# Patient Record
Sex: Male | Born: 1970 | Race: White | Hispanic: No | Smoking: Never smoker
Health system: Southern US, Community
[De-identification: ages and names within clinical notes are randomized; demographics above are authoritative.]

## PROBLEM LIST (undated history)

## (undated) ENCOUNTER — Ambulatory Visit: Admission: EM | Payer: No Typology Code available for payment source

## (undated) DIAGNOSIS — F4310 Post-traumatic stress disorder, unspecified: Secondary | ICD-10-CM

## (undated) DIAGNOSIS — M542 Cervicalgia: Secondary | ICD-10-CM

## (undated) DIAGNOSIS — K219 Gastro-esophageal reflux disease without esophagitis: Secondary | ICD-10-CM

## (undated) HISTORY — PX: INGUINAL HERNIA REPAIR: SUR1180

---

## 2018-06-01 DIAGNOSIS — N201 Calculus of ureter: Secondary | ICD-10-CM | POA: Insufficient documentation

## 2018-07-25 DIAGNOSIS — K589 Irritable bowel syndrome without diarrhea: Secondary | ICD-10-CM | POA: Insufficient documentation

## 2018-07-25 DIAGNOSIS — G40909 Epilepsy, unspecified, not intractable, without status epilepticus: Secondary | ICD-10-CM | POA: Insufficient documentation

## 2018-07-25 DIAGNOSIS — R55 Syncope and collapse: Secondary | ICD-10-CM | POA: Insufficient documentation

## 2018-07-25 DIAGNOSIS — F411 Generalized anxiety disorder: Secondary | ICD-10-CM | POA: Insufficient documentation

## 2020-11-08 ENCOUNTER — Emergency Department (HOSPITAL_COMMUNITY): Payer: No Typology Code available for payment source

## 2020-11-08 ENCOUNTER — Encounter (HOSPITAL_COMMUNITY): Payer: Self-pay | Admitting: Emergency Medicine

## 2020-11-08 ENCOUNTER — Emergency Department (HOSPITAL_COMMUNITY)
Admission: EM | Admit: 2020-11-08 | Discharge: 2020-11-08 | Disposition: A | Discharge status: Home / Self Care | Payer: No Typology Code available for payment source | Attending: Emergency Medicine | Admitting: Emergency Medicine

## 2020-11-08 DIAGNOSIS — M25511 Pain in right shoulder: Secondary | ICD-10-CM | POA: Insufficient documentation

## 2020-11-08 DIAGNOSIS — M62838 Other muscle spasm: Secondary | ICD-10-CM

## 2020-11-08 DIAGNOSIS — M542 Cervicalgia: Secondary | ICD-10-CM | POA: Insufficient documentation

## 2020-11-08 MED ORDER — METHOCARBAMOL 750 MG PO TABS: 750.0000 mg | ORAL_TABLET | Freq: Three times a day (TID) | ORAL | 0 refills | Status: DC | PRN

## 2020-11-08 MED ORDER — TRAMADOL HCL 50 MG PO TABS: 50.0000 mg | ORAL_TABLET | Freq: Four times a day (QID) | ORAL | 0 refills | Status: DC | PRN

## 2020-11-08 NOTE — ED Provider Notes (Signed)
[facility] EMERGENCY DEPARTMENT Provider Note   CSN: [id number] Arrival date & time: 11/08/20  1110     History Chief Complaint  Patient presents with   Shoulder Pain    right    Tommy Kaufman is a 50 y.o. male.  Patient c/o right shoulder and right neck pain in the past 4-5 days. Symptoms acute onset, dull, located right trapezius area and right shoulder posteriorly, mod-severe, constant, persistent, worse w certain movements of right shoulder and neck. Denies hx DDD. Denies recent trauma, fall, injury or strain. No fever or chills. Occasional numbness/tingling sensation in finger tips of right hand but no consistent or persistent loss of sensation or numbness. No loss of normal function or weakness. No fever or chills.   The history is provided by the patient.      History reviewed. No pertinent past medical history.  There are no problems to display for this patient.   History reviewed. No pertinent surgical history.     History reviewed. No pertinent family history.  Social History   Tobacco Use   Smoking status: Never   Smokeless tobacco: Never  Substance Use Topics   Alcohol use: Yes   Drug use: Not Currently    Home Medications Prior to Admission medications   Not on File    Allergies    Patient has no allergy information on record.  Review of Systems   Review of Systems  Constitutional:  Negative for chills and fever.  HENT:  Negative for sore throat.   Eyes:  Negative for redness.  Respiratory:  Negative for shortness of breath.   Cardiovascular:  Negative for chest pain.  Gastrointestinal:  Negative for abdominal pain.  Genitourinary:  Negative for flank pain.  Musculoskeletal:  Positive for neck pain.  Skin:  Negative for rash.  Neurological:  Negative for headaches.  Hematological:  Does not bruise/bleed easily.  Psychiatric/Behavioral:  Negative for confusion.    Physical Exam Updated Vital Signs BP 118/73 (BP  Location: Left Arm)   Pulse 65   Temp 98.6 F (37 C) (Oral)   Resp 19   SpO2 95%   Physical Exam Vitals and nursing note reviewed.  Constitutional:      Appearance: Normal appearance. He is well-developed.  HENT:     Head: Atraumatic.     Nose: Nose normal.     Mouth/Throat:     Mouth: Mucous membranes are moist.     Pharynx: Oropharynx is clear.  Eyes:     General: No scleral icterus.    Conjunctiva/sclera: Conjunctivae normal.  Neck:     Trachea: No tracheal deviation.     Comments: No neck stiffness or rigidity.  Cardiovascular:     Rate and Rhythm: Normal rate and regular rhythm.     Pulses: Normal pulses.     Heart sounds: Normal heart sounds. No murmur heard.   No friction rub. No gallop.  Pulmonary:     Effort: Pulmonary effort is normal. No accessory muscle usage or respiratory distress.     Breath sounds: Normal breath sounds.  Abdominal:     General: There is no distension.  Genitourinary:    Comments: No cva tenderness. Musculoskeletal:        General: No swelling.     Cervical back: Normal range of motion and neck supple. No rigidity.     Comments: Tenderness right lateral neck, right trapezius, and right posterior shoulder/right scapula area. Pain w active rom right shoulder. Minimal pain  w passive rom shoulder. C/T spine non tender, aligned. No RUE swelling. No sts noted. No skin changes or erythema noted. Radial pulse 2+.  Skin:    General: Skin is warm and dry.     Findings: No rash.     Comments: No skin lesions or rash in area of pain.   Neurological:     Mental Status: He is alert.     Comments: Alert, speech clear. RUE motor intact, stre 5/5. Sens grossly intact.   Psychiatric:        Mood and Affect: Mood normal.    ED Results / Procedures / Treatments   Labs (all labs ordered are listed, but only abnormal results are displayed) Labs Reviewed - No data to display  EKG EKG Interpretation  Date/Time:  Saturday November 08 2020 11:23:06  EDT Ventricular Rate:  71 PR Interval:  108 QRS Duration: 94 QT Interval:  364 QTC Calculation: 395 R Axis:   51 Text Interpretation: Sinus rhythm with short PR No previous tracing Confirmed by Cathren Laine ([id number]) on 11/08/2020 1:01:48 PM  Radiology DG Shoulder Right  Result Date: 11/08/2020 CLINICAL DATA:  RIGHT shoulder pain for 4 days. History of shoulder dislocation in the past in a 50 year old male. EXAM: RIGHT SHOULDER - 2+ VIEW COMPARISON:  None FINDINGS: RIGHT shoulder is located. No sign of acute fracture. No significant arthropathy. Soft tissues are unremarkable. IMPRESSION: No acute findings. Electronically Signed   By: Donzetta Kohut M.D.   On: 11/08/2020 12:38   CT Cervical Spine Wo Contrast  Result Date: 11/08/2020 CLINICAL DATA:  Cervical radiculopathy EXAM: CT CERVICAL SPINE WITHOUT CONTRAST TECHNIQUE: Multidetector CT imaging of the cervical spine was performed without intravenous contrast. Multiplanar CT image reconstructions were also generated. COMPARISON:  None. FINDINGS: Alignment: Normal Skull base and vertebrae: No acute fracture. No primary bone lesion or focal pathologic process. Soft tissues and spinal canal: No prevertebral fluid or swelling. No visible canal hematoma. Disc levels: Disc space narrowing and early spurring at C5-6. Early bilateral degenerative facet disease. No significant neural foraminal narrowing. Upper chest: No acute findings Other: None IMPRESSION: No acute bony abnormality.  Early degenerative changes. Electronically Signed   By: Charlett Nose M.D.   On: 11/08/2020 12:15    Procedures Procedures   Medications Ordered in ED Medications - No data to display  ED Course  I have reviewed the triage vital signs and the nursing notes.  Pertinent labs & imaging results that were available during my care of the patient were reviewed by me and considered in my medical decision making (see chart for details).    MDM Rules/Calculators/A&P                           Xrays.  Pt drove self to ED.   No meds pta. Acetaminophen po. Ibuprofen po.   CT reviewed/interpreted by me - degen changes, no acute process.  Xrays shoulder reviewed/interpreted by me - no fx or dislocation.   Rx for home/robaxin/ultram.    Final Clinical Impression(s) / ED Diagnoses Final diagnoses:  None    Rx / DC Orders ED Discharge Orders     None        Cathren Laine, MD 11/08/20 1302

## 2020-11-08 NOTE — ED Triage Notes (Signed)
Pt reports R shoulder blade pain that radiates to R axilla x 4 days.  No known injury.  Reports mild neck pain and numbness to R fingers.

## 2020-11-08 NOTE — Discharge Instructions (Signed)
It was our pleasure to provide your ER care today - we hope that you feel better.  Try gentle massage and heat therapy to sore area.   Take ibuprofen or aleve as need for pain. You may also take ultram as need for pain and robaxin as need for muscle spasm/pain - no driving when taking these meds.   Follow up with primary care doctor in the coming week.   Return to ER if worse, new symptoms, fevers, intractable pain, numbness/weakness, or other concern.

## 2020-12-15 DIAGNOSIS — M5412 Radiculopathy, cervical region: Secondary | ICD-10-CM | POA: Insufficient documentation

## 2021-02-11 DIAGNOSIS — G5621 Lesion of ulnar nerve, right upper limb: Secondary | ICD-10-CM | POA: Insufficient documentation

## 2021-09-30 ENCOUNTER — Encounter (HOSPITAL_COMMUNITY): Payer: Self-pay | Admitting: Emergency Medicine

## 2021-09-30 ENCOUNTER — Ambulatory Visit (HOSPITAL_COMMUNITY)
Admission: EM | Admit: 2021-09-30 | Discharge: 2021-09-30 | Disposition: A | Discharge status: Home / Self Care | Payer: No Typology Code available for payment source | Attending: Urgent Care | Admitting: Urgent Care

## 2021-09-30 DIAGNOSIS — R1013 Epigastric pain: Secondary | ICD-10-CM | POA: Insufficient documentation

## 2021-09-30 DIAGNOSIS — G629 Polyneuropathy, unspecified: Secondary | ICD-10-CM | POA: Diagnosis present

## 2021-09-30 HISTORY — DX: Gastro-esophageal reflux disease without esophagitis: K21.9

## 2021-09-30 HISTORY — DX: Cervicalgia: M54.2

## 2021-09-30 HISTORY — DX: Post-traumatic stress disorder, unspecified: F43.10

## 2021-09-30 LAB — VITAMIN B12: Vitamin B-12: 299 pg/mL (ref 180–914)

## 2021-09-30 MED ORDER — BACLOFEN 10 MG PO TABS: 10.0000 mg | ORAL_TABLET | Freq: Three times a day (TID) | ORAL | 0 refills | Status: DC

## 2021-09-30 NOTE — Discharge Instructions (Addendum)
Your EKG is the same as it was in 2022. I do not suspect this to be cardiac in nature. Given your new vegetarianism, I have ordered a B12 level. You may consider purchasing over-the-counter sublingual B12 for supplementation. I suspect your symptoms are related to esophageal spasm. Please start taking baclofen up to 3 times daily.  Usual treatments are calcium channel blockers, but I would prefer this to come from your primary care physician should you require additional treatment. Please start taking your pantoprazole once daily as well.
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
## 2021-09-30 NOTE — ED Triage Notes (Signed)
Patient c/o LFT arm tingling that started today at 0800.   Patient c/o chest tightness that started today 1200.   Patient denies headache.   Patient endorses SOB upon exertion. Patient endorses cervical spine chronic issue.   Patient denies cardiac history.

## 2021-10-02 ENCOUNTER — Telehealth: Payer: Self-pay | Admitting: Urgent Care

## 2021-10-02 NOTE — Telephone Encounter (Signed)
Called and spoke with pt, verified identity with two identifiers, regarding his B12 of 299. Recommended OTC SL B12, he states he picked it up last night. [facility] due to continued neuropathy. Denies any additional complaints, all questions answered

## 2021-12-28 ENCOUNTER — Encounter (HOSPITAL_COMMUNITY): Payer: Self-pay

## 2021-12-28 ENCOUNTER — Ambulatory Visit (INDEPENDENT_AMBULATORY_CARE_PROVIDER_SITE_OTHER): Payer: No Typology Code available for payment source

## 2021-12-28 ENCOUNTER — Ambulatory Visit (HOSPITAL_COMMUNITY)
Admission: EM | Admit: 2021-12-28 | Discharge: 2021-12-28 | Disposition: A | Discharge status: Home / Self Care | Payer: No Typology Code available for payment source | Attending: Internal Medicine | Admitting: Internal Medicine

## 2021-12-28 DIAGNOSIS — S060X0A Concussion without loss of consciousness, initial encounter: Secondary | ICD-10-CM

## 2021-12-28 DIAGNOSIS — R079 Chest pain, unspecified: Secondary | ICD-10-CM

## 2021-12-28 DIAGNOSIS — S20212A Contusion of left front wall of thorax, initial encounter: Secondary | ICD-10-CM | POA: Diagnosis not present

## 2021-12-28 MED ORDER — NAPROXEN 375 MG PO TABS: 375.0000 mg | ORAL_TABLET | Freq: Two times a day (BID) | ORAL | 0 refills | Status: DC
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
## 2021-12-28 NOTE — Discharge Instructions (Addendum)
Apply cold compresses intermittently as needed.   These take medications as prescribed Your chest x-ray is negative for rib fracture Deep breathing exercises recommended to prevent pneumonia If you have worsening headaches, blurred vision, double vision, persistent nausea/vomiting-please go to the emergency department for further evaluation.
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
## 2022-02-20 IMAGING — DX DG SHOULDER 2+V*R*
4 series · 4 of 4 positions shown · non-contrast
Comparison: None

CLINICAL DATA: RIGHT shoulder pain for 4 days. History of shoulder
dislocation in the past in a 50-year-old male.

EXAM:
RIGHT SHOULDER - 2+ VIEW

[shoulder grashey]
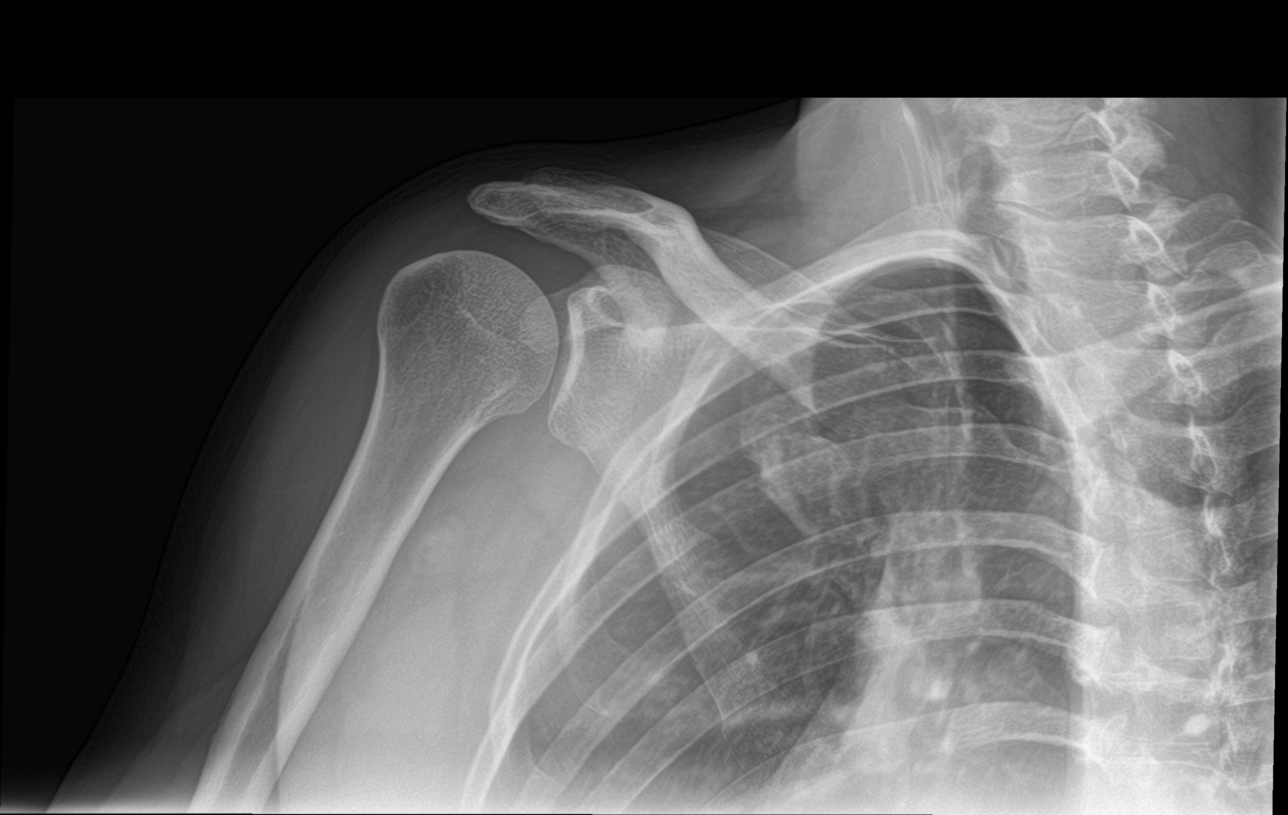

[shoulder y view]
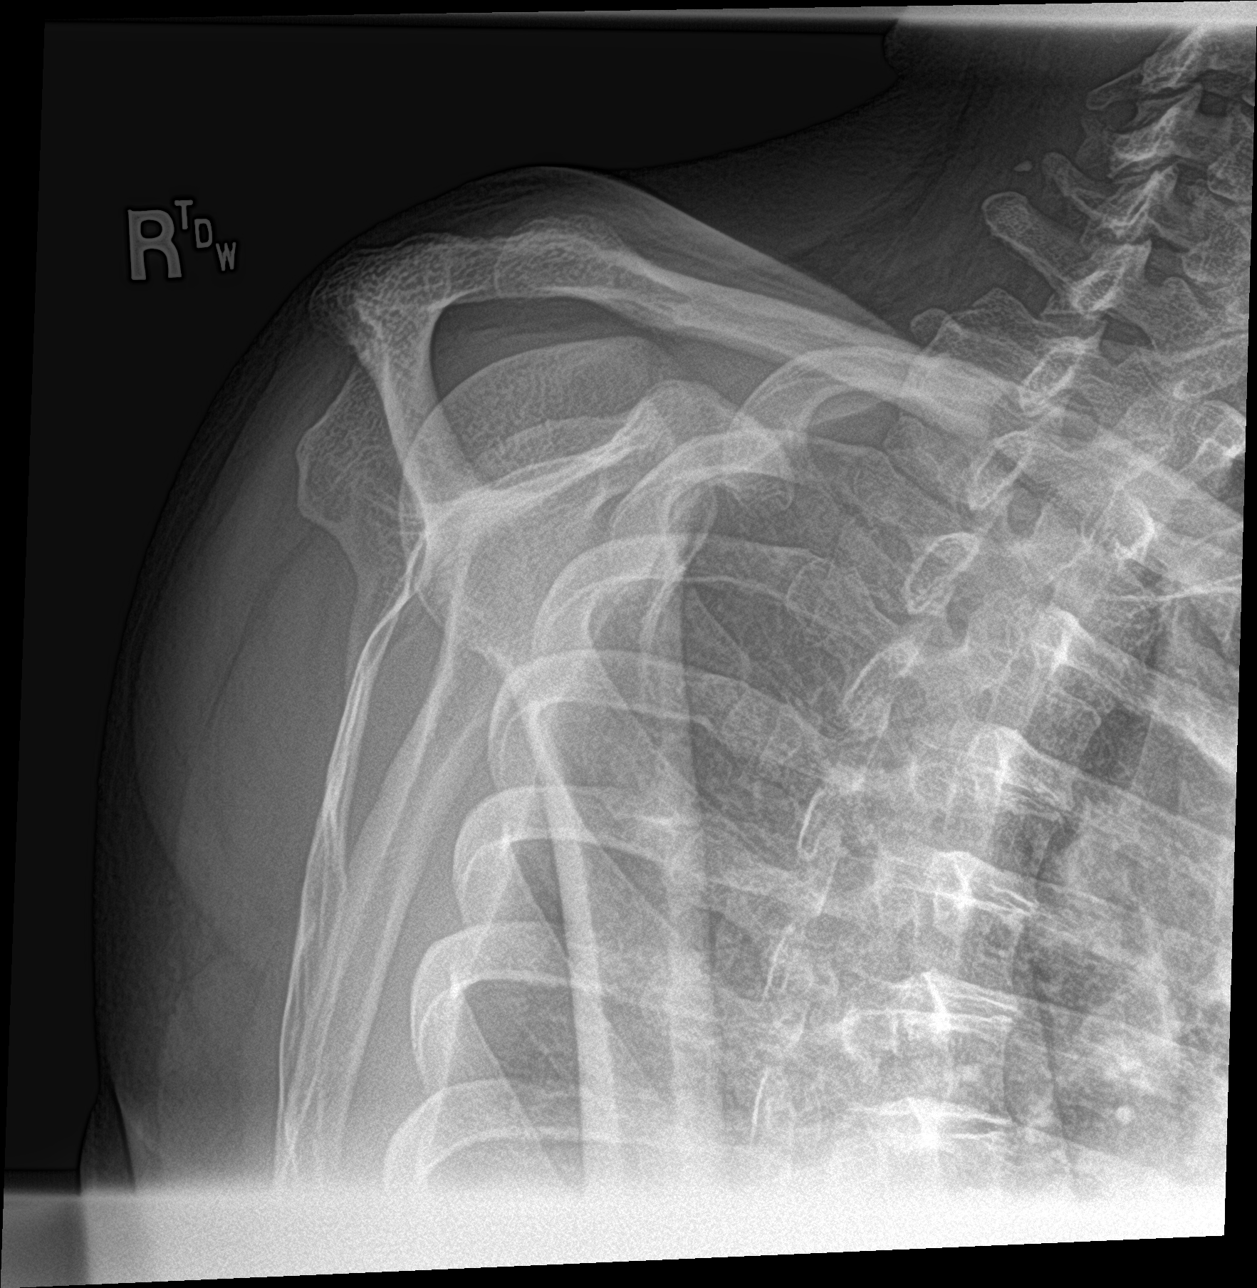

[shoulder axillary]
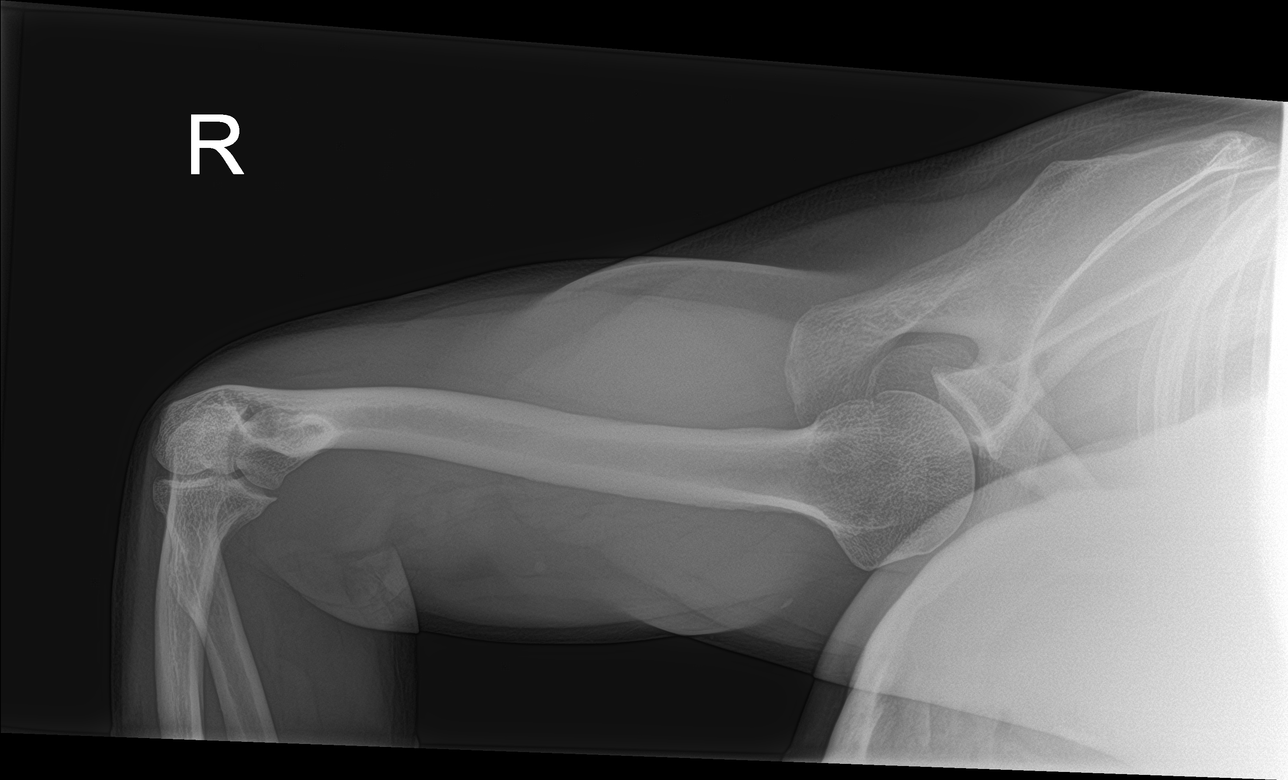

[shoulder ap neutral]
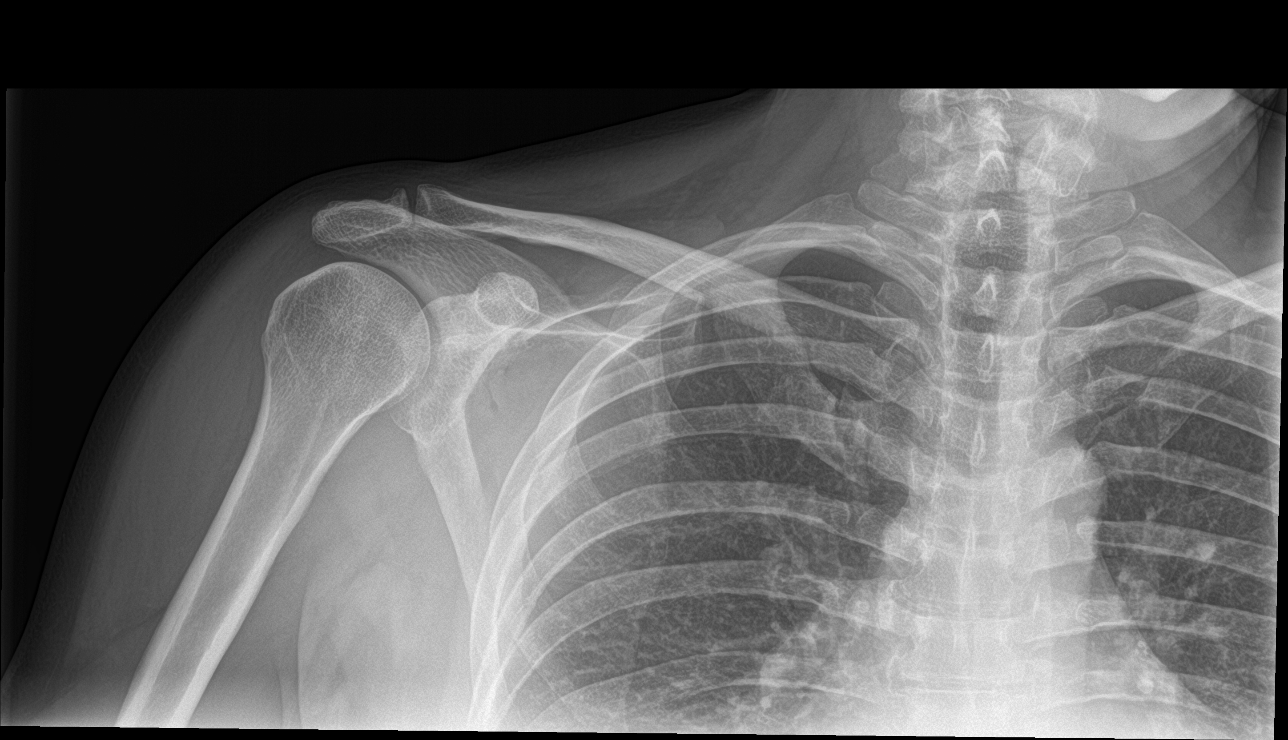

[4 of 4 positions shown; findings below may reference images not displayed]

FINDINGS: RIGHT shoulder is located. No sign of acute fracture. No significant
arthropathy. Soft tissues are unremarkable.
IMPRESSION: No acute findings.

## 2023-03-12 ENCOUNTER — Encounter (HOSPITAL_COMMUNITY): Payer: Self-pay

## 2023-03-12 ENCOUNTER — Ambulatory Visit (HOSPITAL_COMMUNITY)
Admission: EM | Admit: 2023-03-12 | Discharge: 2023-03-12 | Disposition: A | Discharge status: Home / Self Care | Payer: No Typology Code available for payment source | Attending: Family Medicine | Admitting: Family Medicine

## 2023-03-12 DIAGNOSIS — N200 Calculus of kidney: Secondary | ICD-10-CM | POA: Diagnosis not present

## 2023-03-12 DIAGNOSIS — R310 Gross hematuria: Secondary | ICD-10-CM | POA: Diagnosis not present

## 2023-03-12 LAB — POCT URINALYSIS DIP (MANUAL ENTRY)
Bilirubin, UA: NEGATIVE
Glucose, UA: NEGATIVE mg/dL
Ketones, POC UA: NEGATIVE mg/dL
Leukocytes, UA: NEGATIVE
Nitrite, UA: NEGATIVE
Protein Ur, POC: NEGATIVE mg/dL
Spec Grav, UA: 1.02 (ref 1.010–1.025)
Urobilinogen, UA: 0.2 U/dL — AB
pH, UA: 5.5 (ref 5.0–8.0)

## 2023-03-12 MED ORDER — TAMSULOSIN HCL 0.4 MG PO CAPS: 0.4000 mg | ORAL_CAPSULE | Freq: Every day | ORAL | 2 refills | Status: DC

## 2023-03-12 MED ORDER — TAMSULOSIN HCL 0.4 MG PO CAPS: 0.4000 mg | ORAL_CAPSULE | Freq: Every day | ORAL | 0 refills | Status: AC

## 2023-03-12 MED ORDER — KETOROLAC TROMETHAMINE 10 MG PO TABS: 10.0000 mg | ORAL_TABLET | Freq: Four times a day (QID) | ORAL | 0 refills | Status: AC | PRN

## 2023-03-12 MED ORDER — KETOROLAC TROMETHAMINE 30 MG/ML IJ SOLN
INTRAMUSCULAR | Status: AC
  Filled 2023-03-12: qty 1

## 2023-03-12 MED ORDER — KETOROLAC TROMETHAMINE 30 MG/ML IJ SOLN
30.0000 mg | Freq: Once | INTRAMUSCULAR | Status: AC
  Administered 2023-03-12: 30 mg via INTRAMUSCULAR

## 2023-03-12 MED ORDER — ONDANSETRON 4 MG PO TBDP: 4.0000 mg | ORAL_TABLET | Freq: Three times a day (TID) | ORAL | 0 refills | Status: AC | PRN

## 2023-03-12 NOTE — ED Triage Notes (Signed)
Patient here today with c/o LB pain and blood in his urine X 4-5 days. Worsening over the past few days. Patient has h/o kidney stones.

## 2023-03-12 NOTE — ED Provider Notes (Signed)
[facility]    CSN: [id number] Arrival date & time: 03/12/23  1213      History   Chief Complaint Chief Complaint  Patient presents with   Hematuria    HPI Tommy Kaufman is a 52 y.o. male.    Hematuria  Here for right low back pain and right lower abdominal pain and hematuria.  He had been having a little bit of low-grade right low back pain for about 5 days.  But at 330 this morning he awoke with more severe pain in his began to see blood in his urine today.  No fever or chills and no vomiting or diarrhea.  He has had a little bit of nausea off and on the last 5 days.  He does have a history of kidney stones and is seeing urology in the past  Last kidney stone was about 5 years ago  He is not allergic any medications  Last EGFR in care everywhere was greater than 90.   Past Medical History:  Diagnosis Date   Cervical spine pain    GERD (gastroesophageal reflux disease)    PTSD (post-traumatic stress disorder)     There are no active problems to display for this patient.   History reviewed. No pertinent surgical history.     Home Medications    Prior to Admission medications   Medication Sig Start Date End Date Taking? Authorizing Provider  ketorolac (TORADOL) 10 MG tablet Take 1 tablet (10 mg total) by mouth every 6 (six) hours as needed (pain). 03/12/23  Yes Zenia Resides, MD  ondansetron (ZOFRAN-ODT) 4 MG disintegrating tablet Take 1 tablet (4 mg total) by mouth every 8 (eight) hours as needed for nausea or vomiting. 03/12/23  Yes Kashawn Dirr, Janace Aris, MD  gabapentin (NEURONTIN) 300 MG capsule Take 300 mg by mouth 2 (two) times daily.    [provider]  pantoprazole (PROTONIX) 20 MG tablet Take 20 mg by mouth daily.    [provider]  tamsulosin (FLOMAX) 0.4 MG CAPS capsule Take 1 capsule (0.4 mg total) by mouth daily. 03/12/23   Zenia Resides, MD    Family History History reviewed. No pertinent family  history.  Social History Social History   Tobacco Use   Smoking status: Never   Smokeless tobacco: Never  Substance Use Topics   Alcohol use: Yes   Drug use: Not Currently     Allergies   Patient has no known allergies.   Review of Systems Review of Systems  Genitourinary:  Positive for hematuria.     Physical Exam Triage Vital Signs ED Triage Vitals [03/12/23 1227]  Encounter Vitals Group     BP 121/80     Systolic BP Percentile      Diastolic BP Percentile      Pulse Rate 66     Resp 16     Temp 97.9 F (36.6 C)     Temp Source Oral     SpO2 96 %     Weight 185 lb (83.9 kg)     Height 5\' 8"  (1.727 m)     Head Circumference      Peak Flow      Pain Score 4     Pain Loc      Pain Education      Exclude from Growth Chart    No data found.  Updated Vital Signs BP 121/80 (BP Location: Left Arm)   Pulse 66   Temp 97.9 F (  36.6 C) (Oral)   Resp 16   Ht 5\' 8"  (1.727 m)   Wt 83.9 kg   SpO2 96%   BMI 28.13 kg/m   Visual Acuity Right Eye Distance:   Left Eye Distance:   Bilateral Distance:    Right Eye Near:   Left Eye Near:    Bilateral Near:     Physical Exam Vitals reviewed.  Constitutional:      General: He is not in acute distress.    Appearance: He is not toxic-appearing.  HENT:     Mouth/Throat:     Mouth: Mucous membranes are moist.     Pharynx: No oropharyngeal exudate or posterior oropharyngeal erythema.  Eyes:     Extraocular Movements: Extraocular movements intact.     Conjunctiva/sclera: Conjunctivae normal.     Pupils: Pupils are equal, round, and reactive to light.  Cardiovascular:     Rate and Rhythm: Normal rate and regular rhythm.     Heart sounds: No murmur heard. Pulmonary:     Effort: Pulmonary effort is normal.     Breath sounds: Normal breath sounds.  Abdominal:     General: There is no distension.     Palpations: Abdomen is soft.     Tenderness: There is right CVA tenderness. There is no left CVA tenderness.      Comments: There is tenderness in the right lower abdomen and in the right flank.  Musculoskeletal:     Cervical back: Neck supple.  Lymphadenopathy:     Cervical: No cervical adenopathy.  Skin:    Capillary Refill: Capillary refill takes less than 2 seconds.     Coloration: Skin is not jaundiced or pale.     Findings: No rash.  Neurological:     General: No focal deficit present.     Mental Status: He is alert and oriented to person, place, and time.  Psychiatric:        Behavior: Behavior normal.      UC Treatments / Results  Labs (all labs ordered are listed, but only abnormal results are displayed) Labs Reviewed  POCT URINALYSIS DIP (MANUAL ENTRY) - Abnormal; Notable for the following components:      Result Value   Blood, UA moderate (*)    Urobilinogen, UA 0.2 (*)    All other components within normal limits    EKG   Radiology No results found.  Procedures Procedures (including critical care time)  Medications Ordered in UC Medications  ketorolac (TORADOL) 30 MG/ML injection 30 mg (has no administration in time range)    Initial Impression / Assessment and Plan / UC Course  I have reviewed the triage vital signs and the nursing notes.  Pertinent labs & imaging results that were available during my care of the patient were reviewed by me and considered in my medical decision making (see chart for details).     There is a moderate amount of blood on the UA.  Toradol injection is given here and Toradol tablets are sent to the pharmacy.  Flomax is also sent in for 10-day supply.  [facility]  Final Clinical Impressions(s) / UC Diagnoses   Final diagnoses:  Renal stone  Gross hematuria     Discharge Instructions      There is a large amount of blood on your UA as expected.  You have been given a shot of Toradol 30 mg today.  Ketorolac 10 mg tablets--take 1 tablet every 6 hours as  needed for pain.  This is the same medicine that  is in the shot we just gave you  Tamsulosin 0.4 mg--1 daily.  This is to help urinary flow  Ondansetron dissolved in the mouth every 8 hours as needed for nausea or vomiting.      ED Prescriptions     Medication Sig Dispense Auth. Provider   ketorolac (TORADOL) 10 MG tablet Take 1 tablet (10 mg total) by mouth every 6 (six) hours as needed (pain). 20 tablet Welton Bord, Janace Aris, MD   ondansetron (ZOFRAN-ODT) 4 MG disintegrating tablet Take 1 tablet (4 mg total) by mouth every 8 (eight) hours as needed for nausea or vomiting. 10 tablet Zenia Resides, MD   tamsulosin (FLOMAX) 0.4 MG CAPS capsule  (Status: Discontinued) Take 1 capsule (0.4 mg total) by mouth daily. 30 capsule Zenia Resides, MD   tamsulosin (FLOMAX) 0.4 MG CAPS capsule Take 1 capsule (0.4 mg total) by mouth daily. 10 capsule Zenia Resides, MD      I have reviewed the PDMP during this encounter.   Zenia Resides, MD 03/12/23 [phone]

## 2023-03-12 NOTE — Discharge Instructions (Signed)
There is a large amount of blood on your UA as expected.  You have been given a shot of Toradol 30 mg today.  Ketorolac 10 mg tablets--take 1 tablet every 6 hours as needed for pain.  This is the same medicine that is in the shot we just gave you  Tamsulosin 0.4 mg--1 daily.  This is to help urinary flow  Ondansetron dissolved in the mouth every 8 hours as needed for nausea or vomiting.

## 2023-09-27 ENCOUNTER — Inpatient Hospital Stay (HOSPITAL_COMMUNITY)
Admission: EM | Admit: 2023-09-27 | Discharge: 2023-10-01 | DRG: 335 | Disposition: A | Discharge status: Home / Self Care | Attending: Surgery | Admitting: Surgery

## 2023-09-27 ENCOUNTER — Encounter (HOSPITAL_COMMUNITY): Payer: Self-pay

## 2023-09-27 ENCOUNTER — Other Ambulatory Visit: Payer: Self-pay

## 2023-09-27 DIAGNOSIS — K659 Peritonitis, unspecified: Secondary | ICD-10-CM | POA: Diagnosis present

## 2023-09-27 DIAGNOSIS — K529 Noninfective gastroenteritis and colitis, unspecified: Principal | ICD-10-CM | POA: Diagnosis present

## 2023-09-27 DIAGNOSIS — R1084 Generalized abdominal pain: Secondary | ICD-10-CM | POA: Diagnosis not present

## 2023-09-27 DIAGNOSIS — N200 Calculus of kidney: Secondary | ICD-10-CM | POA: Diagnosis present

## 2023-09-27 DIAGNOSIS — Z87442 Personal history of urinary calculi: Secondary | ICD-10-CM

## 2023-09-27 DIAGNOSIS — F4310 Post-traumatic stress disorder, unspecified: Secondary | ICD-10-CM | POA: Diagnosis present

## 2023-09-27 DIAGNOSIS — G40909 Epilepsy, unspecified, not intractable, without status epilepticus: Secondary | ICD-10-CM | POA: Diagnosis present

## 2023-09-27 NOTE — ED Triage Notes (Signed)
 Pt complaining of abdominal pain that started around 5-6 pm today. Had a colonoscopy this morning where they removed 2 polyps. Pt is having hot and cold chills, is bloated, has been vomiting.

## 2023-09-28 ENCOUNTER — Inpatient Hospital Stay (HOSPITAL_COMMUNITY): Admitting: Certified Registered Nurse Anesthetist

## 2023-09-28 ENCOUNTER — Emergency Department (HOSPITAL_COMMUNITY)

## 2023-09-28 ENCOUNTER — Encounter (HOSPITAL_COMMUNITY): Payer: Self-pay

## 2023-09-28 ENCOUNTER — Encounter (HOSPITAL_COMMUNITY): Admission: EM | Disposition: A | Discharge status: Home / Self Care | Payer: Self-pay | Source: Home / Self Care

## 2023-09-28 ENCOUNTER — Other Ambulatory Visit: Payer: Self-pay

## 2023-09-28 DIAGNOSIS — K529 Noninfective gastroenteritis and colitis, unspecified: Principal | ICD-10-CM

## 2023-09-28 DIAGNOSIS — F32A Depression, unspecified: Secondary | ICD-10-CM | POA: Insufficient documentation

## 2023-09-28 DIAGNOSIS — K659 Peritonitis, unspecified: Secondary | ICD-10-CM

## 2023-09-28 DIAGNOSIS — N200 Calculus of kidney: Secondary | ICD-10-CM | POA: Diagnosis present

## 2023-09-28 DIAGNOSIS — F513 Sleepwalking [somnambulism]: Secondary | ICD-10-CM | POA: Insufficient documentation

## 2023-09-28 DIAGNOSIS — G43109 Migraine with aura, not intractable, without status migrainosus: Secondary | ICD-10-CM | POA: Insufficient documentation

## 2023-09-28 DIAGNOSIS — F4312 Post-traumatic stress disorder, chronic: Secondary | ICD-10-CM | POA: Insufficient documentation

## 2023-09-28 DIAGNOSIS — Z87442 Personal history of urinary calculi: Secondary | ICD-10-CM | POA: Diagnosis not present

## 2023-09-28 DIAGNOSIS — F4310 Post-traumatic stress disorder, unspecified: Secondary | ICD-10-CM | POA: Diagnosis present

## 2023-09-28 DIAGNOSIS — G40909 Epilepsy, unspecified, not intractable, without status epilepticus: Secondary | ICD-10-CM | POA: Diagnosis present

## 2023-09-28 DIAGNOSIS — R1084 Generalized abdominal pain: Secondary | ICD-10-CM | POA: Diagnosis present

## 2023-09-28 HISTORY — PX: LAPAROSCOPIC ABDOMINAL EXPLORATION: SHX6249

## 2023-09-28 LAB — COMPREHENSIVE METABOLIC PANEL WITH GFR
ALT: 30 U/L (ref 0–44)
AST: 21 U/L (ref 15–41)
Albumin: 4 g/dL (ref 3.5–5.0)
Alkaline Phosphatase: 51 U/L (ref 38–126)
Anion gap: 9 (ref 5–15)
BUN: 12 mg/dL (ref 6–20)
CO2: 23 mmol/L (ref 22–32)
Calcium: 8.7 mg/dL — ABNORMAL LOW (ref 8.9–10.3)
Chloride: 106 mmol/L (ref 98–111)
Creatinine, Ser: 0.95 mg/dL (ref 0.61–1.24)
GFR, Estimated: >60 mL/min (ref >60)
Glucose, Bld: 118 mg/dL — ABNORMAL HIGH (ref 70–99)
Potassium: 3.7 mmol/L (ref 3.5–5.1)
Sodium: 138 mmol/L (ref 135–145)
Total Bilirubin: 1.4 mg/dL — ABNORMAL HIGH (ref 0.0–1.2)
Total Protein: 6.9 g/dL (ref 6.5–8.1)

## 2023-09-28 LAB — CBC
HCT: 43.4 % (ref 39.0–52.0)
Hemoglobin: 14.8 g/dL (ref 13.0–17.0)
MCH: 31.4 pg (ref 26.0–34.0)
MCHC: 34.1 g/dL (ref 30.0–36.0)
MCV: 91.9 fL (ref 80.0–100.0)
Platelets: 200 K/uL (ref 150–400)
RBC: 4.72 MIL/uL (ref 4.22–5.81)
RDW: 12.7 % (ref 11.5–15.5)
WBC: 16.1 K/uL — ABNORMAL HIGH (ref 4.0–10.5)
nRBC: 0 % (ref 0.0–0.2)

## 2023-09-28 LAB — URINALYSIS, ROUTINE W REFLEX MICROSCOPIC
Bacteria, UA: NONE SEEN
Bilirubin Urine: NEGATIVE
Glucose, UA: NEGATIVE mg/dL
Ketones, ur: NEGATIVE mg/dL
Leukocytes,Ua: NEGATIVE
Nitrite: NEGATIVE
Protein, ur: NEGATIVE mg/dL
Specific Gravity, Urine: 1.02 (ref 1.005–1.030)
pH: 5 (ref 5.0–8.0)

## 2023-09-28 LAB — LIPASE, BLOOD: Lipase: 31 U/L (ref 11–51)

## 2023-09-28 SURGERY — EXPLORATION, ABDOMEN, LAPAROSCOPIC
Anesthesia: General | Site: Abdomen

## 2023-09-28 MED ORDER — 0.9 % SODIUM CHLORIDE (POUR BTL) OPTIME
TOPICAL | Status: DC | PRN
Start: 2023-09-28 — End: 2023-09-28
  Administered 2023-09-28 (×2): 1000 mL

## 2023-09-28 MED ORDER — SODIUM CHLORIDE 0.9 % IV BOLUS
1000.0000 mL | Freq: Once | INTRAVENOUS | Status: AC
  Administered 2023-09-28: 1000 mL via INTRAVENOUS

## 2023-09-28 MED ORDER — DEXAMETHASONE SODIUM PHOSPHATE 10 MG/ML IJ SOLN
INTRAMUSCULAR | Status: AC
  Filled 2023-09-28: qty 1

## 2023-09-28 MED ORDER — HYDROMORPHONE HCL 1 MG/ML IJ SOLN
0.2500 mg | INTRAMUSCULAR | Status: DC | PRN
  Administered 2023-09-28 (×4): 0.5 mg via INTRAVENOUS

## 2023-09-28 MED ORDER — CHLORHEXIDINE GLUCONATE 0.12 % MT SOLN
15.0000 mL | Freq: Once | OROMUCOSAL | Status: AC
  Administered 2023-09-28: 15 mL via OROMUCOSAL

## 2023-09-28 MED ORDER — ONDANSETRON HCL 4 MG/2ML IJ SOLN
INTRAMUSCULAR | Status: AC
Start: 2023-09-28 — End: 2023-09-28
  Filled 2023-09-28: qty 2

## 2023-09-28 MED ORDER — HYDROMORPHONE HCL 1 MG/ML IJ SOLN
1.0000 mg | INTRAMUSCULAR | Status: DC | PRN
  Administered 2023-09-28: 1 mg via INTRAVENOUS
  Administered 2023-09-28 (×2): 2 mg via INTRAVENOUS
  Administered 2023-09-29: 1 mg via INTRAVENOUS
  Administered 2023-09-29 (×4): 2 mg via INTRAVENOUS
  Administered 2023-09-29: 1 mg via INTRAVENOUS
  Administered 2023-09-29 (×2): 2 mg via INTRAVENOUS
  Administered 2023-09-30: 1 mg via INTRAVENOUS
  Administered 2023-09-30 (×2): 2 mg via INTRAVENOUS
  Administered 2023-09-30: 1 mg via INTRAVENOUS
  Filled 2023-09-28 (×2): qty 2
  Filled 2023-09-28: qty 1
  Filled 2023-09-28: qty 2
  Filled 2023-09-28: qty 1
  Filled 2023-09-28 (×4): qty 2
  Filled 2023-09-28: qty 1
  Filled 2023-09-28: qty 2
  Filled 2023-09-28 (×2): qty 1
  Filled 2023-09-28 (×2): qty 2

## 2023-09-28 MED ORDER — IOHEXOL 300 MG/ML  SOLN
100.0000 mL | Freq: Once | INTRAMUSCULAR | Status: AC | PRN
  Administered 2023-09-28: 100 mL via INTRAVENOUS

## 2023-09-28 MED ORDER — DEXAMETHASONE SODIUM PHOSPHATE 10 MG/ML IJ SOLN
INTRAMUSCULAR | Status: DC | PRN
  Administered 2023-09-28: 10 mg via INTRAVENOUS

## 2023-09-28 MED ORDER — METHOCARBAMOL 500 MG PO TABS
500.0000 mg | ORAL_TABLET | Freq: Three times a day (TID) | ORAL | Status: DC
  Administered 2023-09-28 – 2023-10-01 (×9): 500 mg via ORAL
  Filled 2023-09-28 (×10): qty 1

## 2023-09-28 MED ORDER — HYDROMORPHONE HCL 1 MG/ML IJ SOLN
INTRAMUSCULAR | Status: AC
Start: 2023-09-28 — End: 2023-09-28
  Filled 2023-09-28: qty 1

## 2023-09-28 MED ORDER — ENOXAPARIN SODIUM 40 MG/0.4ML IJ SOSY: 40.0000 mg | PREFILLED_SYRINGE | INTRAMUSCULAR | Status: DC

## 2023-09-28 MED ORDER — LACTATED RINGERS IV SOLN: INTRAVENOUS | Status: DC

## 2023-09-28 MED ORDER — ROCURONIUM BROMIDE 10 MG/ML (PF) SYRINGE
PREFILLED_SYRINGE | INTRAVENOUS | Status: AC
  Filled 2023-09-28: qty 10

## 2023-09-28 MED ORDER — OXYCODONE HCL 5 MG PO TABS
5.0000 mg | ORAL_TABLET | ORAL | Status: DC | PRN
  Administered 2023-09-29 – 2023-10-01 (×7): 10 mg via ORAL
  Filled 2023-09-28 (×8): qty 2

## 2023-09-28 MED ORDER — FENTANYL CITRATE (PF) 100 MCG/2ML IJ SOLN
INTRAMUSCULAR | Status: AC
Start: 2023-09-28 — End: 2023-09-28
  Filled 2023-09-28: qty 2

## 2023-09-28 MED ORDER — ACETAMINOPHEN 650 MG RE SUPP: 650.0000 mg | Freq: Four times a day (QID) | RECTAL | Status: DC

## 2023-09-28 MED ORDER — HYDROMORPHONE HCL 1 MG/ML IJ SOLN
INTRAMUSCULAR | Status: DC | PRN
  Administered 2023-09-28 (×2): .5 mg via INTRAVENOUS
  Administered 2023-09-28: 1 mg via INTRAVENOUS

## 2023-09-28 MED ORDER — PIPERACILLIN-TAZOBACTAM 3.375 G IVPB 30 MIN
3.3750 g | Freq: Once | INTRAVENOUS | Status: AC
  Administered 2023-09-28: 3.375 g via INTRAVENOUS
  Filled 2023-09-28: qty 50

## 2023-09-28 MED ORDER — FENTANYL CITRATE (PF) 100 MCG/2ML IJ SOLN
INTRAMUSCULAR | Status: AC
  Filled 2023-09-28: qty 2

## 2023-09-28 MED ORDER — FENTANYL CITRATE (PF) 100 MCG/2ML IJ SOLN
INTRAMUSCULAR | Status: DC | PRN
  Administered 2023-09-28: 100 ug via INTRAVENOUS
  Administered 2023-09-28 (×2): 50 ug via INTRAVENOUS

## 2023-09-28 MED ORDER — ACETAMINOPHEN 500 MG PO TABS
ORAL_TABLET | ORAL | Status: AC
  Filled 2023-09-28: qty 2

## 2023-09-28 MED ORDER — SUCCINYLCHOLINE CHLORIDE 200 MG/10ML IV SOSY
PREFILLED_SYRINGE | INTRAVENOUS | Status: DC | PRN
  Administered 2023-09-28: 120 mg via INTRAVENOUS

## 2023-09-28 MED ORDER — PROPOFOL 10 MG/ML IV BOLUS
INTRAVENOUS | Status: AC
  Filled 2023-09-28: qty 20

## 2023-09-28 MED ORDER — LIDOCAINE HCL (CARDIAC) PF 100 MG/5ML IV SOSY
PREFILLED_SYRINGE | INTRAVENOUS | Status: DC | PRN
  Administered 2023-09-28: 100 mg via INTRAVENOUS

## 2023-09-28 MED ORDER — ACETAMINOPHEN 325 MG PO TABS
650.0000 mg | ORAL_TABLET | Freq: Four times a day (QID) | ORAL | Status: DC | PRN
Start: 2023-09-28 — End: 2023-09-28
  Filled 2023-09-28: qty 2

## 2023-09-28 MED ORDER — KCL IN DEXTROSE-NACL 20-5-0.45 MEQ/L-%-% IV SOLN
INTRAVENOUS | Status: AC
  Filled 2023-09-28 (×3): qty 1000

## 2023-09-28 MED ORDER — ONDANSETRON HCL 4 MG PO TABS
4.0000 mg | ORAL_TABLET | Freq: Four times a day (QID) | ORAL | Status: DC | PRN
  Administered 2023-09-28: 4 mg via ORAL

## 2023-09-28 MED ORDER — ENOXAPARIN SODIUM 40 MG/0.4ML IJ SOSY
40.0000 mg | PREFILLED_SYRINGE | INTRAMUSCULAR | Status: DC
  Administered 2023-09-29 – 2023-10-01 (×3): 40 mg via SUBCUTANEOUS
  Filled 2023-09-28 (×3): qty 0.4

## 2023-09-28 MED ORDER — BUPIVACAINE HCL (PF) 0.5 % IJ SOLN
INTRAMUSCULAR | Status: AC
  Filled 2023-09-28: qty 30

## 2023-09-28 MED ORDER — POTASSIUM CHLORIDE 2 MEQ/ML IV SOLN: INTRAVENOUS | Status: DC

## 2023-09-28 MED ORDER — LIDOCAINE HCL (PF) 2 % IJ SOLN
INTRAMUSCULAR | Status: AC
Start: 2023-09-28 — End: 2023-09-28
  Filled 2023-09-28: qty 5

## 2023-09-28 MED ORDER — HYDROMORPHONE HCL 1 MG/ML IJ SOLN
INTRAMUSCULAR | Status: AC
  Filled 2023-09-28: qty 1

## 2023-09-28 MED ORDER — ONDANSETRON HCL 4 MG/2ML IJ SOLN
4.0000 mg | Freq: Four times a day (QID) | INTRAMUSCULAR | Status: DC | PRN
  Administered 2023-09-29 – 2023-09-30 (×2): 4 mg via INTRAVENOUS
  Filled 2023-09-28 (×3): qty 2

## 2023-09-28 MED ORDER — SUGAMMADEX SODIUM 200 MG/2ML IV SOLN
INTRAVENOUS | Status: DC | PRN
  Administered 2023-09-28: 200 mg via INTRAVENOUS

## 2023-09-28 MED ORDER — ACETAMINOPHEN 325 MG PO TABS
650.0000 mg | ORAL_TABLET | Freq: Four times a day (QID) | ORAL | Status: DC
  Administered 2023-09-28 – 2023-10-01 (×11): 650 mg via ORAL
  Filled 2023-09-28 (×11): qty 2

## 2023-09-28 MED ORDER — DROPERIDOL 2.5 MG/ML IJ SOLN: 0.6250 mg | Freq: Once | INTRAMUSCULAR | Status: DC | PRN

## 2023-09-28 MED ORDER — PROPOFOL 10 MG/ML IV BOLUS
INTRAVENOUS | Status: DC | PRN
Start: 2023-09-28 — End: 2023-09-28
  Administered 2023-09-28: 200 mg via INTRAVENOUS

## 2023-09-28 MED ORDER — PANTOPRAZOLE SODIUM 40 MG IV SOLR
40.0000 mg | INTRAVENOUS | Status: DC
  Administered 2023-09-28 – 2023-09-29 (×2): 40 mg via INTRAVENOUS
  Filled 2023-09-28 (×2): qty 10

## 2023-09-28 MED ORDER — BUPIVACAINE HCL 0.5 % IJ SOLN
INTRAMUSCULAR | Status: DC | PRN
Start: 2023-09-28 — End: 2023-09-28
  Administered 2023-09-28: 20 mL

## 2023-09-28 MED ORDER — MIDAZOLAM HCL 5 MG/5ML IJ SOLN
INTRAMUSCULAR | Status: DC | PRN
  Administered 2023-09-28: 2 mg via INTRAVENOUS

## 2023-09-28 MED ORDER — HYDROMORPHONE HCL 1 MG/ML IJ SOLN
1.0000 mg | INTRAMUSCULAR | Status: DC | PRN
  Administered 2023-09-28: 1 mg via INTRAVENOUS
  Filled 2023-09-28: qty 1

## 2023-09-28 MED ORDER — ACETAMINOPHEN 500 MG PO TABS: 1000.0000 mg | ORAL_TABLET | Freq: Once | ORAL | Status: DC

## 2023-09-28 MED ORDER — HYDROMORPHONE HCL 1 MG/ML IJ SOLN: 1.0000 mg | INTRAMUSCULAR | Status: DC | PRN

## 2023-09-28 MED ORDER — MIDAZOLAM HCL 2 MG/2ML IJ SOLN
INTRAMUSCULAR | Status: AC
  Filled 2023-09-28: qty 2

## 2023-09-28 MED ORDER — HYDROMORPHONE HCL 1 MG/ML IJ SOLN
1.0000 mg | Freq: Once | INTRAMUSCULAR | Status: AC
  Administered 2023-09-28: 1 mg via INTRAVENOUS
  Filled 2023-09-28: qty 1

## 2023-09-28 MED ORDER — ROCURONIUM BROMIDE 10 MG/ML (PF) SYRINGE
PREFILLED_SYRINGE | INTRAVENOUS | Status: DC | PRN
  Administered 2023-09-28: 50 mg via INTRAVENOUS
  Administered 2023-09-28: 10 mg via INTRAVENOUS
  Administered 2023-09-28: 20 mg via INTRAVENOUS

## 2023-09-28 MED ORDER — PHENYLEPHRINE 80 MCG/ML (10ML) SYRINGE FOR IV PUSH (FOR BLOOD PRESSURE SUPPORT)
PREFILLED_SYRINGE | INTRAVENOUS | Status: DC | PRN
  Administered 2023-09-28: 80 ug via INTRAVENOUS
  Administered 2023-09-28: 160 ug via INTRAVENOUS

## 2023-09-28 MED ORDER — ONDANSETRON HCL 4 MG/2ML IJ SOLN
4.0000 mg | Freq: Once | INTRAMUSCULAR | Status: AC
  Administered 2023-09-28: 4 mg via INTRAVENOUS
  Filled 2023-09-28: qty 2

## 2023-09-28 MED ORDER — ONDANSETRON HCL 4 MG/2ML IJ SOLN
INTRAMUSCULAR | Status: DC | PRN
  Administered 2023-09-28: 4 mg via INTRAVENOUS

## 2023-09-28 MED ORDER — PIPERACILLIN-TAZOBACTAM 3.375 G IVPB
3.3750 g | Freq: Three times a day (TID) | INTRAVENOUS | Status: DC
  Administered 2023-09-28 – 2023-10-01 (×8): 3.375 g via INTRAVENOUS
  Filled 2023-09-28 (×9): qty 50

## 2023-09-28 MED ORDER — HYDROMORPHONE HCL 2 MG/ML IJ SOLN
INTRAMUSCULAR | Status: AC
  Filled 2023-09-28: qty 1

## 2023-09-28 MED ORDER — ACETAMINOPHEN 650 MG RE SUPP: 650.0000 mg | Freq: Four times a day (QID) | RECTAL | Status: DC | PRN

## 2023-09-28 MED ORDER — STERILE WATER FOR IRRIGATION IR SOLN
Status: DC | PRN
Start: 2023-09-28 — End: 2023-09-28
  Administered 2023-09-28: 1000 mL

## 2023-09-28 SURGICAL SUPPLY — 51 items
APPLICATOR COTTON TIP 6 STRL (MISCELLANEOUS) ×4 IMPLANT
BAG COUNTER SPONGE SURGICOUNT (BAG) IMPLANT
BLADE EXTENDED COATED 6.5IN (ELECTRODE) IMPLANT
BLADE HEX COATED 2.75 (ELECTRODE) ×2 IMPLANT
BLADE SURG SZ10 CARB STEEL (BLADE) IMPLANT
CABLE HIGH FREQUENCY MONO STRZ (ELECTRODE) ×1 IMPLANT
CHLORAPREP W/TINT 26 (MISCELLANEOUS) ×2 IMPLANT
CLIP TI LARGE 6 (CLIP) IMPLANT
COVER MAYO STAND STRL (DRAPES) ×2 IMPLANT
COVER SURGICAL LIGHT HANDLE (MISCELLANEOUS) ×2 IMPLANT
DRAPE LAPAROSCOPIC ABDOMINAL (DRAPES) ×2 IMPLANT
DRAPE SHEET LG 3/4 BI-LAMINATE (DRAPES) IMPLANT
DRAPE WARM FLUID 44X44 (DRAPES) ×2 IMPLANT
ELECT REM PT RETURN 15FT ADLT (MISCELLANEOUS) ×2 IMPLANT
GAUZE PAD ABD 8X10 STRL (GAUZE/BANDAGES/DRESSINGS) IMPLANT
GAUZE SPONGE 4X4 12PLY STRL (GAUZE/BANDAGES/DRESSINGS) ×2 IMPLANT
GLOVE BIO SURGEON STRL SZ7.5 (GLOVE) ×2 IMPLANT
GLOVE BIOGEL PI IND STRL 7.0 (GLOVE) ×2 IMPLANT
GOWN STRL REUS W/ TWL XL LVL3 (GOWN DISPOSABLE) ×5 IMPLANT
HANDLE SUCTION POOLE (INSTRUMENTS) ×2 IMPLANT
IRRIGATION SUCT STRKRFLW 2 WTP (MISCELLANEOUS) ×1 IMPLANT
KIT BASIN OR (CUSTOM PROCEDURE TRAY) ×2 IMPLANT
KIT TURNOVER KIT A (KITS) ×2 IMPLANT
LEGGING LITHOTOMY PAIR STRL (DRAPES) IMPLANT
LIGASURE IMPACT 36 18CM CVD LR (INSTRUMENTS) IMPLANT
NS IRRIG 1000ML POUR BTL (IV SOLUTION) ×4 IMPLANT
PACK GENERAL/GYN (CUSTOM PROCEDURE TRAY) ×2 IMPLANT
RELOAD PROXIMATE 75MM BLUE (ENDOMECHANICALS) IMPLANT
RELOAD STAPLE 75 3.8 BLU REG (ENDOMECHANICALS) IMPLANT
SCISSORS LAP 5X45 EPIX DISP (ENDOMECHANICALS) ×1 IMPLANT
SEALER TISSUE G2 STRG ARTC 35C (ENDOMECHANICALS) ×1 IMPLANT
SHEARS HARMONIC 36 ACE (MISCELLANEOUS) IMPLANT
STAPLER GUN LINEAR PROX 60 (STAPLE) IMPLANT
STAPLER PROXIMATE 75MM BLUE (STAPLE) IMPLANT
STAPLER SKIN PROX 35W (STAPLE) ×1 IMPLANT
SUT NOV 1 T60/GS (SUTURE) IMPLANT
SUT NOVA NAB DX-16 0-1 5-0 T12 (SUTURE) IMPLANT
SUT NOVA T20/GS 25 (SUTURE) IMPLANT
SUT PDS AB 1 TP1 96 (SUTURE) ×2 IMPLANT
SUT SILK 2 0 SH CR/8 (SUTURE) ×3 IMPLANT
SUT SILK 2 0SH CR/8 30 (SUTURE) IMPLANT
SUT SILK 2-0 18XBRD TIE 12 (SUTURE) ×2 IMPLANT
SUT SILK 2-0 30XBRD TIE 12 (SUTURE) IMPLANT
SUT SILK 3 0 SH CR/8 (SUTURE) ×2 IMPLANT
SUT SILK 3-0 18XBRD TIE 12 (SUTURE) ×4 IMPLANT
SUT VICRYL 0 UR6 27IN ABS (SUTURE) ×1 IMPLANT
TOWEL OR 17X26 10 PK STRL BLUE (TOWEL DISPOSABLE) ×4 IMPLANT
TRAY FOLEY MTR SLVR 16FR STAT (SET/KITS/TRAYS/PACK) ×2 IMPLANT
TROCAR BALLN 12MMX100 BLUNT (TROCAR) ×1 IMPLANT
WATER STERILE IRR 1000ML POUR (IV SOLUTION) ×1 IMPLANT
YANKAUER SUCT BULB TIP NO VENT (SUCTIONS) ×2 IMPLANT

## 2023-09-28 NOTE — Anesthesia Preprocedure Evaluation (Addendum)
 Anesthesia Evaluation  Patient identified by MRN, date of birth, ID band Patient awake    Reviewed: Allergy & Precautions, NPO status , Patient's Chart, lab work & pertinent test results  History of Anesthesia Complications Negative for: history of anesthetic complications  Airway Mallampati: II  TM Distance: >3 FB Neck ROM: Full    Dental no notable dental hx.    Pulmonary neg pulmonary ROS   Pulmonary exam normal        Cardiovascular negative cardio ROS Normal cardiovascular exam     Neuro/Psych  Headaches, Seizures -,   Anxiety Depression       GI/Hepatic Neg liver ROS,GERD  Medicated and Controlled,,Concern for colonic perforation   Endo/Other  negative endocrine ROS    Renal/GU negative Renal ROS  negative genitourinary   Musculoskeletal negative musculoskeletal ROS (+)    Abdominal   Peds  Hematology negative hematology ROS (+)   Anesthesia Other Findings Day of surgery medications reviewed with patient.  Reproductive/Obstetrics negative OB ROS                              Anesthesia Physical Anesthesia Plan  ASA: 2  Anesthesia Plan: General   Post-op Pain Management: Tylenol  PO (pre-op)*   Induction: Intravenous and Rapid sequence  PONV Risk Score and Plan: 2 and Treatment may vary due to age or medical condition, Ondansetron , Dexamethasone  and Midazolam   Airway Management Planned: Oral ETT  Additional Equipment: None  Intra-op Plan:   Post-operative Plan: Extubation in OR  Informed Consent: I have reviewed the patients History and Physical, chart, labs and discussed the procedure including the risks, benefits and alternatives for the proposed anesthesia with the patient or authorized representative who has indicated his/her understanding and acceptance.     Dental advisory given  Plan Discussed with: CRNA  Anesthesia Plan Comments:           Anesthesia Quick Evaluation

## 2023-09-28 NOTE — Op Note (Signed)
 Tommy Kaufman 09/27/2023 - 09/28/2023   Pre-op Diagnosis: Acute abdominal pain, peritonitis s/p colonoscopy     Post-op Diagnosis: same  Procedure(s): Diagnostic Laparoscopy, Evaluation of the Splenic Flexur   Surgeon(s): Vernetta Berg, MD Debby Hila, MD  Anesthesia: General  Staff:  Circulator: Gretta Leonor CROME, RN Relief Circulator: Manalo, Maelin O, RN Scrub Person: Claudene Lum SAILOR; Elaine, [place], [place]  Estimated Blood Loss: less than 50 mL  Indications: This is a 53 year old gentleman who is status post a colonoscopy 24 hours ago with a polypectomy.  Several hours after procedure he developed severe diffuse abdominal pain.  He presented to the emergency department and a CT scan of the abdomen pelvis showed inflammatory changes of the splenic flexure of the colon.  There was no free air and no free fluid.  His white blood count was 16,000 and he had fairly significant peritonitis on physical examination.  After discussion with the patient and his wife the decision was made to proceed to the operating room for diagnostic laparoscopy  Findings: Upon entering the abdomen laparoscopically there was no gross evidence of perforation in the abdomen regarding free fluid or fibrinous exudate.  The omentum was mobilized off of the splenic flexure and the transverse colon was erythematous and slightly firm for several inches of the distal transverse colon going to the flexure with there was no gross evidence of perforation  Procedure: The patient was brought to the operating identified the correct patient.  He was placed upon the operating table general anesthesia was induced.  His abdomen was then prepped and draped in usual sterile fashion.  I made a small vertical incision below the umbilicus with a scalpel.  I then carried this down to the fascia which was then opened with scalpel as well.  I then gained entrance to the peritoneal cavity with a Kelly clamp under direct vision.  A  0 Vicryl pursing suture was placed around the fascial opening.  The [place] port placed the opening and insufflation of the abdomen was began.  I placed 2 more 5 mm trocars in the patient's upper midline under direct vision.  Under direct visualization there was no gross evidence of perforation with no fibrinous exudate or free fluid in the abdomen.  He had a very thickened omentum which was fixated down the pelvis as well as open to the splenic flexure.  At this point I had one of my colorectal partners presented to the procedure.  She then proceeded to laparoscopically takedown the omentum from the abdominal sidewall and mobilize the colon enough going toward the splenic flexure of both the descending colon and transverse colon.  She is finally able to free up all the omentum off of the top of the splenic flexure.  A several inch segment of the splenic flexure was erythematous and slightly firm but there was no evidence of perforation.  There was no evidence of any area coming out after irrigating the area.  There was no gross contamination or any fibrinous exudate.  At this point after finding no perforation the decision was made to just irrigate the abdomen further and terminate procedure.  We irrigated the abdomen saline.  Hemostasis peer to be achieved.  All ports were then removed under direct vision and the abdomen was deflated.  We tied the 0 Vicryl in place at the umbilicus closing the fascial defect.  All incisions were then anesthetized with Marcaine  and closed with 4-0 Monocryl sutures.  Dermabond was then applied.  The patient tolerated the procedure well.  All the counts were correct at the end of the procedure.  The patient was then extubated in the operating room and taken in stable condition to the recovery room.          Vicenta Poli   Date: 09/28/2023  Time: 1:36 PM

## 2023-09-28 NOTE — Plan of Care (Signed)

## 2023-09-28 NOTE — Anesthesia Procedure Notes (Signed)
 Procedure Name: Intubation Date/Time: 09/28/2023 12:12 PM  Performed by: Carleton Garnette SAUNDERS, CRNAPre-anesthesia Checklist: Patient identified, Emergency Drugs available, Suction available, Patient being monitored and Timeout performed Patient Re-evaluated:Patient Re-evaluated prior to induction Preoxygenation: Pre-oxygenation with 100% oxygen Induction Type: IV induction and Rapid sequence Laryngoscope Size: Mac and 4 Grade View: Grade I Tube type: Oral Tube size: 7.5 mm Number of attempts: 1 Airway Equipment and Method: Stylet Placement Confirmation: ETT inserted through vocal cords under direct vision, positive ETCO2 and breath sounds checked- equal and bilateral Secured at: 23 cm Tube secured with: Tape Dental Injury: Teeth and Oropharynx as per pre-operative assessment

## 2023-09-28 NOTE — ED Notes (Signed)
 Provided patient with ice chips. Ok to give by provider Griselda.

## 2023-09-28 NOTE — H&P (Signed)
 History and Physical    Patient: Tommy Kaufman FMW:[id number] DOB: Dec 28, 1970 DOA: 09/27/2023 DOS: the patient was seen and examined on 09/28/2023 PCP: [facility]  Patient coming from: Home  Chief Complaint:  Chief Complaint  Patient presents with   Abdominal Pain   HPI: Tommy Kaufman is a 53 y.o. male with medical history significant of cervical spine pain, GERD, PTSD, depression, anxiety, nephrolithiasis, IBS and migraine headaches, seizure disorder right ulnar neuropathy vasovagal syncope who underwent in a colonoscopy on Monday, which had to be stopped due to the patient awakening during the procedure.  He underwent colonoscopy the following day (yesterday) under sedation and had  2 polyps removed, 3 mm polyp from the transverse colon as well as a 2 mm polyp from the sigmoid colon.  He stated that after the procedure around 2 PM he has had generalized abdominal pain associated with diaphoresis and 2 episodes of emesis.  No constipation, melena or hematochezia.  No flank pain, dysuria, frequency or hematuria. He denied fever, chills, rhinorrhea, sore throat, wheezing or hemoptysis.  No chest pain, palpitations, diaphoresis, PND, orthopnea or pitting edema of the lower extremities.  No polyuria, polydipsia, polyphagia or blurred vision.   Lab work: Urinalysis was hazy with small hemoglobin but otherwise unremarkable.  CBC showed a white count of 16.1, hemoglobin 14.8 g/dL platelets 799.  CMP showed a glucose of 118 and calcium 8.2 mg/dL, the rest of the CMP values were normal.  Imaging: Portable 1 view chest radiograph low lung volumes, no active cardiopulmonary disease.  CT abdomen/pelvis with contrast showing focal inflammatory changes at the splenic flexure of the colon with wall thickening and mild pericolonic stranding, without free air or free fluid.  These may be related to the colonoscopy and biopsies performed yesterday.   ED course: Initial vital signs were temperature  98.7 F, pulse 72, respiration 16, BP 169/97 mmHg O2 sat 98% on room air.  Patient received normal saline 2000 mL liter bolus, hydromorphone  1 mg IVP x 3, ondansetron  4 mg IVP x 1 and Zosyn  3.375 g IVPB.  Review of Systems: As mentioned in the history of present illness. All other systems reviewed and are negative. Past Medical History:  Diagnosis Date   Cervical spine pain    GERD (gastroesophageal reflux disease)    PTSD (post-traumatic stress disorder)    History reviewed. No pertinent surgical history. Social History:  reports that he has never smoked. He has never used smokeless tobacco. He reports current alcohol use. He reports that he does not currently use drugs.  No Known Allergies  History reviewed. No pertinent family history.  Prior to Admission medications   Medication Sig Start Date End Date Taking? Authorizing Provider  gabapentin (NEURONTIN) 300 MG capsule Take 300 mg by mouth 2 (two) times daily as needed.   Yes [provider]  ketorolac  (TORADOL ) 10 MG tablet Take 1 tablet (10 mg total) by mouth every 6 (six) hours as needed (pain). 03/12/23  Yes Vonna Sharlet POUR, MD  ondansetron  (ZOFRAN -ODT) 4 MG disintegrating tablet Take 1 tablet (4 mg total) by mouth every 8 (eight) hours as needed for nausea or vomiting. 03/12/23  Yes Vonna Sharlet POUR, MD  pantoprazole  (PROTONIX ) 20 MG tablet Take 20 mg by mouth daily as needed.   Yes [provider]  tamsulosin  (FLOMAX ) 0.4 MG CAPS capsule Take 1 capsule (0.4 mg total) by mouth daily. Patient not taking: Reported on 09/28/2023 03/12/23   Vonna Sharlet POUR, MD  Physical Exam: Vitals:   09/28/23 0252 09/28/23 0354 09/28/23 0600 09/28/23 0632  BP:  106/68 112/74 107/76  Pulse:  98  90  Resp:  18  18  Temp: 99.4 F (37.4 C)   98.6 F (37 C)  TempSrc: Oral   Oral  SpO2:  97%  95%  Weight:      Height:       Physical Exam Vitals reviewed.  Constitutional:      Appearance: He is ill-appearing.   HENT:     Head: Normocephalic.     Nose: No rhinorrhea.  Eyes:     General: No scleral icterus.    Pupils: Pupils are equal, round, and reactive to light.  Cardiovascular:     Rate and Rhythm: Normal rate.  Pulmonary:     Effort: Pulmonary effort is normal.     Breath sounds: Normal breath sounds. No wheezing, rhonchi or rales.  Abdominal:     General: Bowel sounds are normal. There is no distension.     Tenderness: There is generalized abdominal tenderness.  Musculoskeletal:     Cervical back: Neck supple.     Right lower leg: No edema.     Left lower leg: No edema.  Skin:    General: Skin is warm and dry.  Neurological:     General: No focal deficit present.     Mental Status: He is alert and oriented to person, place, and time.  Psychiatric:        Mood and Affect: Mood normal.        Behavior: Behavior normal.     Data Reviewed:  Results are pending, will review when available.  Assessment and Plan: Principal Problem:   Acute colitis High suspicion for peritonitis. General surgery will take him to the OR. Inpatient/MedSurg. Continue IV fluids. Keep n.p.o. for now. Analgesics as needed. Antiemetics as needed. Pantoprazole  40 mg IVP daily. Follow CBC, CMP and lipase in AM.     Advance Care Planning:   Code Status: Full Code   Consults: General surgery Alverda Poli, MD).  Family Communication:   Severity of Illness: The appropriate patient status for this patient is INPATIENT. Inpatient status is judged to be reasonable and necessary in order to provide the required intensity of service to ensure the patient's safety. The patient's presenting symptoms, physical exam findings, and initial radiographic and laboratory data in the context of their chronic comorbidities is felt to place them at high risk for further clinical deterioration. Furthermore, it is not anticipated that the patient will be medically stable for discharge from the hospital within 2  midnights of admission.   * I certify that at the point of admission it is my clinical judgment that the patient will require inpatient hospital care spanning beyond 2 midnights from the point of admission due to high intensity of service, high risk for further deterioration and high frequency of surveillance required.*  Author: Alm Dorn Castor, MD 09/28/2023 7:44 AM  For on call review [email or web address].   This document was prepared using Dragon voice recognition software and may contain some unintended transcription errors.

## 2023-09-28 NOTE — ED Provider Notes (Signed)
 [place] EMERGENCY DEPARTMENT AT [facility] Provider Note   CSN: [id number] Arrival date & time: 09/27/23  2252     Patient presents with: Abdominal Pain   Tommy Kaufman is a 53 y.o. male.   The history is provided by the patient.  Abdominal Pain Tommy Kaufman is a 53 y.o. male who presents to the Emergency Department complaining of abdominal pain.  He presents emergency department for evaluation of severe abdominal pain that started around 2 PM.  He had a colonoscopy performed on July 7 that had to be discontinued secondary to him awakening during the procedure.  He had to do a redo prep for colonoscopy on Monday night and had a second colonoscopy performed under sedation on July 8.  During that colonoscopy he had 2 polyps removed, 3 mm polyp from the transverse colon as well as a 2 mm polyp from the sigmoid colon.  He states that since 2 PM he has had generalized abdominal pain, cold sweats and 2 episodes of emesis.  He does continue to pass gas.  He has a history of IBS and PTSD, no additional medical problems. Colonoscopy was performed at the [facility] by Wayne Zurowski      Prior to Admission medications   Medication Sig Start Date End Date Taking? Authorizing Provider  gabapentin (NEURONTIN) 300 MG capsule Take 300 mg by mouth 2 (two) times daily.    [provider]  ketorolac  (TORADOL ) 10 MG tablet Take 1 tablet (10 mg total) by mouth every 6 (six) hours as needed (pain). 03/12/23   Vonna Sharlet POUR, MD  ondansetron  (ZOFRAN -ODT) 4 MG disintegrating tablet Take 1 tablet (4 mg total) by mouth every 8 (eight) hours as needed for nausea or vomiting. 03/12/23   Vonna Sharlet POUR, MD  pantoprazole  (PROTONIX ) 20 MG tablet Take 20 mg by mouth daily.    [provider]  tamsulosin  (FLOMAX ) 0.4 MG CAPS capsule Take 1 capsule (0.4 mg total) by mouth daily. 03/12/23   Banister, Pamela K, MD    Allergies: Patient has no known allergies.    Review of Systems   Gastrointestinal:  Positive for abdominal pain.  All other systems reviewed and are negative.   Updated Vital Signs BP 106/68 (BP Location: Right Arm)   Pulse 98   Temp 99.4 F (37.4 C) (Oral)   Resp 18   Ht 5' 8 (1.727 m)   Wt 83.5 kg   SpO2 97%   BMI 27.98 kg/m   Physical Exam Vitals and nursing note reviewed.  Constitutional:      Appearance: He is well-developed.  HENT:     Head: Normocephalic and atraumatic.  Cardiovascular:     Rate and Rhythm: Normal rate and regular rhythm.  Pulmonary:     Effort: Pulmonary effort is normal. No respiratory distress.     Breath sounds: No stridor.  Abdominal:     General: There is distension.     Palpations: Abdomen is soft.     Tenderness: There is no guarding or rebound.     Comments: Moderate to severe generalized abdominal tenderness  Musculoskeletal:        General: No tenderness.  Skin:    General: Skin is warm and dry.  Neurological:     Mental Status: He is alert and oriented to person, place, and time.  Psychiatric:        Behavior: Behavior normal.     (all labs ordered are listed, but only abnormal results are displayed)  Labs Reviewed  COMPREHENSIVE METABOLIC PANEL WITH GFR - Abnormal; Notable for the following components:      Result Value   Glucose, Bld 118 (*)    Calcium 8.7 (*)    Total Bilirubin 1.4 (*)    All other components within normal limits  CBC - Abnormal; Notable for the following components:   WBC 16.1 (*)    All other components within normal limits  URINALYSIS, ROUTINE W REFLEX MICROSCOPIC - Abnormal; Notable for the following components:   APPearance HAZY (*)    Hgb urine dipstick SMALL (*)    All other components within normal limits  LIPASE, BLOOD    EKG: None  Radiology: CT ABDOMEN PELVIS W CONTRAST Result Date: 09/28/2023 EXAM: CT ABDOMEN AND PELVIS WITH CONTRAST 09/28/2023 03:56:25 AM TECHNIQUE: CT of the abdomen and pelvis was performed with the administration of intravenous  contrast. Multiplanar reformatted images are provided for review. Automated exposure control, iterative reconstruction, and/or weight based adjustment of the mA/kV was utilized to reduce the radiation dose to as low as reasonably achievable. COMPARISON: None available. CLINICAL HISTORY: Abdominal pain, acute, nonlocalized; Peritonitis or perforation suspected; s/p colonoscopy today with bx of sigmoid and transverse colon polyps, severe AP. abdominal pain that started around 5-6 pm today. Pt is having hot and cold chills, is bloated, has been vomiting, wbc's 16.1, GFR>60, + microhematuria FINDINGS: LOWER CHEST: Mild dependent atelectasis is present. LIVER: The liver is unremarkable. GALLBLADDER AND BILE DUCTS: Gallbladder is unremarkable. No biliary ductal dilatation. SPLEEN: No acute abnormality. PANCREAS: No acute abnormality. ADRENAL GLANDS: No acute abnormality. KIDNEYS, URETERS AND BLADDER: No stones in the kidneys or ureters. No hydronephrosis. No perinephric or periureteral stranding. Urinary bladder is unremarkable. GI AND BOWEL: Focal inflammatory changes are present at the splenic flexure of the colon. Wall thickening is present. Mild pericolonic stranding is noted. No free air or free fluid is present. The more distal colon is within normal limits. PERITONEUM AND RETROPERITONEUM: No ascites. No free air. VASCULATURE: Scattered atherosclerotic calcifications are present in the abdominal aorta without aneurysm. LYMPH NODES: No lymphadenopathy. REPRODUCTIVE ORGANS: No acute abnormality. BONES AND SOFT TISSUES: No acute osseous abnormality. Fat herniates into the left inguinal canal. IMPRESSION: 1. Focal inflammatory changes at the splenic flexure of the colon with wall thickening and mild pericolonic stranding, without free air or free fluid. This may be related to the colonoscopy and biopsies performed yesterday. Electronically signed by: Lonni Necessary MD 09/28/2023 04:07 AM EDT RP Workstation:  [id number]   DG Chest Port 1 View Result Date: 09/28/2023 CLINICAL DATA:  Recent colonoscopy. Severe abdominal pain, fever, chills EXAM: PORTABLE CHEST 1 VIEW COMPARISON:  12/28/2021 FINDINGS: Low lung volumes. Heart and mediastinal contours are within normal limits. No focal opacities or effusions. No acute bony abnormality. No pneumothorax. IMPRESSION: Low lung volumes.  No active cardiopulmonary disease. Electronically Signed   By: Franky Crease M.D.   On: 09/28/2023 01:11     Procedures   Medications Ordered in the ED  piperacillin -tazobactam (ZOSYN ) IVPB 3.375 g (has no administration in time range)  sodium chloride  0.9 % bolus 1,000 mL (has no administration in time range)  HYDROmorphone  (DILAUDID ) injection 1 mg (1 mg Intravenous Given 09/28/23 0148)  ondansetron  (ZOFRAN ) injection 4 mg (4 mg Intravenous Given 09/28/23 0149)  sodium chloride  0.9 % bolus 1,000 mL (1,000 mLs Intravenous New Bag/Given 09/28/23 0148)  iohexol  (OMNIPAQUE ) 300 MG/ML solution 100 mL (100 mLs Intravenous Contrast Given 09/28/23 0341)  HYDROmorphone  (DILAUDID ) injection 1 mg (  1 mg Intravenous Given 09/28/23 0403)                                    Medical Decision Making Amount and/or Complexity of Data Reviewed Labs: ordered. Radiology: ordered.  Risk Prescription drug management. Decision regarding hospitalization.   Patient without significant medical history here for evaluation of generalized abdominal pain, chills after having a colonoscopy earlier today.  He does have significant tenderness on examination.  CBC with leukocytosis.  CT abdomen pelvis was obtained, which does show colonic inflammation but no evidence of perforation.  Given these findings he was started on IV antibiotics.  Discussed with Dr. Aron with the surgery team-surgery will see the patient in consult.  Medicine consulted for admission for ongoing observation and pain management.  Patient updated Priscille studies and he is in agreement with  admission for ongoing care.     Final diagnoses:  Generalized abdominal pain  Inflammation of colonic mucosa    ED Discharge Orders     None          Griselda Norris, MD 09/28/23 [phone]

## 2023-09-28 NOTE — Anesthesia Postprocedure Evaluation (Signed)
 Anesthesia Post Note  Patient: Tommy Kaufman  Procedure(s) Performed: EXPLORATION, ABDOMEN, LAPAROSCOPIC, diagnostic laparoscopy, evaluation of splenic flexure (Abdomen)     Patient location during evaluation: PACU Anesthesia Type: General Level of consciousness: awake and alert Pain management: pain level controlled Vital Signs Assessment: post-procedure vital signs reviewed and stable Respiratory status: spontaneous breathing, nonlabored ventilation and respiratory function stable Cardiovascular status: blood pressure returned to baseline Postop Assessment: no apparent nausea or vomiting Anesthetic complications: no   No notable events documented.  Last Vitals:  Vitals:   09/28/23 1530 09/28/23 1545  BP: 119/81 123/80  Pulse: 91 82  Resp: 13 16  Temp:    SpO2: 95% 92%    Last Pain:  Vitals:   09/28/23 1539  TempSrc:   PainSc: 6                  Vertell Row

## 2023-09-28 NOTE — Transfer of Care (Signed)
 Immediate Anesthesia Transfer of Care Note  Patient: Tommy Kaufman  Procedure(s) Performed: EXPLORATION, ABDOMEN, LAPAROSCOPIC, diagnostic laparoscopy, evaluation of splenic flexure (Abdomen)  Patient Location: PACU  Anesthesia Type:General  Level of Consciousness: sedated  Airway & Oxygen Therapy: Patient Spontanous Breathing and Patient connected to face mask oxygen  Post-op Assessment: Report given to RN and Post -op Vital signs reviewed and stable  Post vital signs: Reviewed and stable  Last Vitals:  Vitals Value Taken Time  BP 134/78 09/28/23 13:58  Temp    Pulse 101 09/28/23 14:00  Resp 24 09/28/23 14:00  SpO2 95 % 09/28/23 14:00  Vitals shown include unfiled device data.  Last Pain:  Vitals:   09/28/23 1104  TempSrc:   PainSc: 5       Patients Stated Pain Goal: 2 (09/28/23 1104)  Complications: No notable events documented.

## 2023-09-28 NOTE — H&P (Signed)
 Tommy Kaufman July 08, 1970  [id number].     Chief Complaint/Reason for Consult: severe abdominal pain following colonoscopy  HPI:  This is a pleasant 53 yo male with a history of nephrolithiasis and PTSD who underwent a first attempt at c-scope at the [facility] on Monday but woke up with conscious sedation and unable to completed the procedure.  He was reprepped and proceeded with a repeat c-scope on Tuesday with general anesthesia.  He has 2 polyps removed, sigmoid colon and transverse colon.  He felt ok initially when waking up.  He had a soda and crackers.  He ate about 6-8 bites of Cava about noon, but had to stop due to pain and feeling full.  He then began to vomit and did so a total of 2 times.  He then developed worsening abdominal pain along with sweats and chills.  No fevers.  He presented to the ED for evaluation.  He has a WBC of 16K and a CT scan with focal inflammatory changes at the splenic flexure with wall thickening and pericolonic stranding without free air or fluid.  We have been asked to see.  ROS: ROS: see HPI  History reviewed. No pertinent family history.  Past Medical History:  Diagnosis Date   Cervical spine pain    GERD (gastroesophageal reflux disease)    PTSD (post-traumatic stress disorder)     Past Surgical History:  Procedure Laterality Date   INGUINAL HERNIA REPAIR Bilateral    age 2, 73    Social History:  reports that he has never smoked. He has never used smokeless tobacco. He reports current alcohol use. He reports current drug use. Drug: Marijuana.  Allergies: No Known Allergies  (Not in a hospital admission)    Physical Exam: Blood pressure 91/61, pulse 85, temperature 98.6 F (37 C), temperature source Oral, resp. rate 16, height 5' 8 (1.727 m), weight 83.5 kg, SpO2 98%. General: pleasant, WD, WN white male who is laying in bed in obvious moderate distress secondary to pain, turning and rolling in the bed.  Unable to get  comfortable. HEENT: head is normocephalic, atraumatic.  Sclera are noninjected.  PERRL.  Ears and nose without any masses or lesions.  Mouth is pink and dry Heart: regular, rate, and rhythm.  Normal s1,s2. No obvious murmurs, gallops, or rubs noted.  Palpable radial and pedal pulses bilaterally Lungs: CTAB, no wheezes, rhonchi, or rales noted.  Respiratory effort nonlabored Abd: soft, ND, significant abdominal pain in the upper abdomen, greatest on left side.  Voluntary guarding with peritonitis present. +BS, no masses, hernias, or organomegaly MS: all 4 extremities are symmetrical with no cyanosis, clubbing, or edema. Skin: warm and dry with no masses, lesions, or rashes Neuro: Cranial nerves 2-12 grossly intact, sensation is normal throughout Psych: A&Ox3 with an appropriate affect.   Results for orders placed or performed during the hospital encounter of 09/27/23 (from the past 48 hours)  Lipase, blood     Status: None   Collection Time: 09/27/23 12:45 AM  Result Value Ref Range   Lipase 31 11 - 51 U/L    Comment: Performed at [facility], [address], [place], [place] [address]  Comprehensive metabolic panel     Status: Abnormal   Collection Time: 09/27/23 12:45 AM  Result Value Ref Range   Sodium 138 135 - 145 mmol/L   Potassium 3.7 3.5 - 5.1 mmol/L   Chloride 106 98 - 111 mmol/L   CO2 23 22 -  32 mmol/L   Glucose, Bld 118 (H) 70 - 99 mg/dL    Comment: Glucose reference range applies only to samples taken after fasting for at least 8 hours.   BUN 12 6 - 20 mg/dL   Creatinine, Ser 9.04 0.61 - 1.24 mg/dL   Calcium 8.7 (L) 8.9 - 10.3 mg/dL   Total Protein 6.9 6.5 - 8.1 g/dL   Albumin 4.0 3.5 - 5.0 g/dL   AST 21 15 - 41 U/L   ALT 30 0 - 44 U/L   Alkaline Phosphatase 51 38 - 126 U/L   Total Bilirubin 1.4 (H) 0.0 - 1.2 mg/dL   GFR, Estimated >39 >39 mL/min    Comment: (NOTE) Calculated using the CKD-EPI Creatinine Equation (2021)    Anion gap 9 5 - 15     Comment: Performed at [facility], [address] [place], [place] [address]  CBC     Status: Abnormal   Collection Time: 09/27/23 12:45 AM  Result Value Ref Range   WBC 16.1 (H) 4.0 - 10.5 K/uL   RBC 4.72 4.22 - 5.81 MIL/uL   Hemoglobin 14.8 13.0 - 17.0 g/dL   HCT 56.5 60.9 - 47.9 %   MCV 91.9 80.0 - 100.0 fL   MCH 31.4 26.0 - 34.0 pg   MCHC 34.1 30.0 - 36.0 g/dL   RDW 87.2 88.4 - 84.4 %   Platelets 200 150 - 400 K/uL   nRBC 0.0 0.0 - 0.2 %    Comment: Performed at [facility], [address] [place], [place] [address]  Urinalysis, Routine w reflex microscopic -Urine, Clean Catch     Status: Abnormal   Collection Time: 09/27/23 12:45 AM  Result Value Ref Range   Color, Urine YELLOW YELLOW   APPearance HAZY (A) CLEAR   Specific Gravity, Urine 1.020 1.005 - 1.030   pH 5.0 5.0 - 8.0   Glucose, UA NEGATIVE NEGATIVE mg/dL   Hgb urine dipstick SMALL (A) NEGATIVE   Bilirubin Urine NEGATIVE NEGATIVE   Ketones, ur NEGATIVE NEGATIVE mg/dL   Protein, ur NEGATIVE NEGATIVE mg/dL   Nitrite NEGATIVE NEGATIVE   Leukocytes,Ua NEGATIVE NEGATIVE   RBC / HPF 0-5 0 - 5 RBC/hpf   WBC, UA 0-5 0 - 5 WBC/hpf   Bacteria, UA NONE SEEN NONE SEEN   Squamous Epithelial / HPF 0-5 0 - 5 /HPF   Mucus PRESENT     Comment: Performed at [facility], [address], [place], [place] [address]   CT ABDOMEN PELVIS W CONTRAST Result Date: 09/28/2023 EXAM: CT ABDOMEN AND PELVIS WITH CONTRAST 09/28/2023 03:56:25 AM TECHNIQUE: CT of the abdomen and pelvis was performed with the administration of intravenous contrast. Multiplanar reformatted images are provided for review. Automated exposure control, iterative reconstruction, and/or weight based adjustment of the mA/kV was utilized to reduce the radiation dose to as low as reasonably achievable. COMPARISON: None available. CLINICAL HISTORY: Abdominal pain, acute, nonlocalized; Peritonitis or perforation  suspected; s/p colonoscopy today with bx of sigmoid and transverse colon polyps, severe AP. abdominal pain that started around 5-6 pm today. Pt is having hot and cold chills, is bloated, has been vomiting, wbc's 16.1, GFR>60, + microhematuria FINDINGS: LOWER CHEST: Mild dependent atelectasis is present. LIVER: The liver is unremarkable. GALLBLADDER AND BILE DUCTS: Gallbladder is unremarkable. No biliary ductal dilatation. SPLEEN: No acute abnormality. PANCREAS: No acute abnormality. ADRENAL GLANDS: No acute abnormality. KIDNEYS, URETERS AND BLADDER: No stones in the kidneys or ureters.  No hydronephrosis. No perinephric or periureteral stranding. Urinary bladder is unremarkable. GI AND BOWEL: Focal inflammatory changes are present at the splenic flexure of the colon. Wall thickening is present. Mild pericolonic stranding is noted. No free air or free fluid is present. The more distal colon is within normal limits. PERITONEUM AND RETROPERITONEUM: No ascites. No free air. VASCULATURE: Scattered atherosclerotic calcifications are present in the abdominal aorta without aneurysm. LYMPH NODES: No lymphadenopathy. REPRODUCTIVE ORGANS: No acute abnormality. BONES AND SOFT TISSUES: No acute osseous abnormality. Fat herniates into the left inguinal canal. IMPRESSION: 1. Focal inflammatory changes at the splenic flexure of the colon with wall thickening and mild pericolonic stranding, without free air or free fluid. This may be related to the colonoscopy and biopsies performed yesterday. Electronically signed by: Lonni Necessary MD 09/28/2023 04:07 AM EDT RP Workstation: [id number]   DG Chest Port 1 View Result Date: 09/28/2023 CLINICAL DATA:  Recent colonoscopy. Severe abdominal pain, fever, chills EXAM: PORTABLE CHEST 1 VIEW COMPARISON:  12/28/2021 FINDINGS: Low Kaufman volumes. Heart and mediastinal contours are within normal limits. No focal opacities or effusions. No acute bony abnormality. No pneumothorax. IMPRESSION:  Low Kaufman volumes.  No active cardiopulmonary disease. Electronically Signed   By: Franky Crease M.D.   On: 09/28/2023 01:11      Assessment/Plan Peritonitis after colonoscopy with polypectomies at the [facility] on 7/8 The patient has been seen, examined, labs, vitals, chart, care everywhere (colonoscopy report), and imaging personally reviewed.  The patient has clear peritonitis on exam and is very uncomfortable rolling in the bed at times.  He does not have free air or fluid on his CT scan, but I am concerned he has a significant injury to his colon given his peritonitis on exam.  His WBC is 16K.  He has been given 1 dose of Zosyn  and I have continued this indefinitely.  I have made him NPO.  We discussed the need to go to the OR for ex lap with likely partial colectomy/colostomy given concern for bowel injury.  He understands.  We discussed that the colostomy is temporary in nature.  We discussed risks and complications of surgery including, but not limited to bleeding, infection, risk of hernia, post op abscess and need for IR drain, PNA, DVT/PE, anesthesia risk of MI, CVA, or death.  He understands and is agreeable to proceed.  His significant other is at the bedside and understands.  All questions were answered at that time.  Discussed with the hospitalist.  We will take him and admit him to our service.   FEN - NPO/IVFs VTE - SCDs in OR, lovenox  likely to follow  ID - zosyn  Admit - inpatient, med-surg  PTSD nephrolithiasis  I reviewed ED provider notes, hospitalist notes, last 24 h vitals and pain scores, last 48 h intake and output, last 24 h labs and trends, and last 24 h imaging results.  Burnard FORBES Banter, [facility] Surgery 09/28/2023, 10:05 AM Please see Amion for pager number during day hours 7:00am-4:30pm or 7:00am -11:30am on weekends

## 2023-09-28 NOTE — Hospital Course (Signed)
 53 year old man IBS and PTSD presented emergency department complaining of abdominal pain that started around 2-3 PM today. He had a colonoscopy at the [facility] performed on July 7 that had to be discontinued secondary to him awakening during the procedure. He had to do a redo prep for colonoscopy on Monday night and had a second colonoscopy performed under sedation on July 8. During that colonoscopy he had 2 polyps removed, 3 mm polyp from the transverse colon as well as a 2 mm polyp from the sigmoid colon. He states that since 2 PM he has had generalized abdominal pain, cold sweats and 2 episodes of emesis. He does continue to pass gas.   At presentation to ED patient found hypertensive however blood pressure has been improved.  CBC showing leukocytosis 16.1.  CMP unremarkable Eksir elevated bilirubin 1.4.  UA no evidence of UTI.  Normal lipase level.  Chest x-ray no acute cardiopulmonary disease.  CT abdomen pelvis Focal inflammatory changes at the splenic flexure of the colon with wall thickening and mild pericolonic stranding, without free air or free fluid. This may be related to the colonoscopy and biopsies performed yesterday.  In the ED patient has been received Zosyn  and 2 L of NS bolus. Dr. Griselda consulted and discussed case with general surgery Dr. Aron, there is no specific recommendation yet and will see patient in the morning. Given CT abdomen pelvis showing some pericolonic stranding/inflammation patient been started on IV Zosyn  empirically.   Hospitalist has been consulted for further evaluation management of colonoscopy related bowel inflammation.

## 2023-09-29 ENCOUNTER — Encounter (HOSPITAL_COMMUNITY): Payer: Self-pay | Admitting: Surgery

## 2023-09-29 LAB — CBC
HCT: 36.1 % — ABNORMAL LOW (ref 39.0–52.0)
Hemoglobin: 11.9 g/dL — ABNORMAL LOW (ref 13.0–17.0)
MCH: 31 pg (ref 26.0–34.0)
MCHC: 33 g/dL (ref 30.0–36.0)
MCV: 94 fL (ref 80.0–100.0)
Platelets: 188 K/uL (ref 150–400)
RBC: 3.84 MIL/uL — ABNORMAL LOW (ref 4.22–5.81)
RDW: 12.9 % (ref 11.5–15.5)
WBC: 11.9 K/uL — ABNORMAL HIGH (ref 4.0–10.5)
nRBC: 0 % (ref 0.0–0.2)

## 2023-09-29 LAB — COMPREHENSIVE METABOLIC PANEL WITH GFR
ALT: 21 U/L (ref 0–44)
AST: 17 U/L (ref 15–41)
Albumin: 3.4 g/dL — ABNORMAL LOW (ref 3.5–5.0)
Alkaline Phosphatase: 41 U/L (ref 38–126)
Anion gap: 10 (ref 5–15)
BUN: 7 mg/dL (ref 6–20)
CO2: 22 mmol/L (ref 22–32)
Calcium: 8.1 mg/dL — ABNORMAL LOW (ref 8.9–10.3)
Chloride: 101 mmol/L (ref 98–111)
Creatinine, Ser: 0.73 mg/dL (ref 0.61–1.24)
GFR, Estimated: >60 mL/min (ref >60)
Glucose, Bld: 123 mg/dL — ABNORMAL HIGH (ref 70–99)
Potassium: 3.6 mmol/L (ref 3.5–5.1)
Sodium: 133 mmol/L — ABNORMAL LOW (ref 135–145)
Total Bilirubin: 1.7 mg/dL — ABNORMAL HIGH (ref 0.0–1.2)
Total Protein: 6.3 g/dL — ABNORMAL LOW (ref 6.5–8.1)

## 2023-09-29 LAB — HIV ANTIBODY (ROUTINE TESTING W REFLEX): HIV Screen 4th Generation wRfx: NONREACTIVE

## 2023-09-29 MED ORDER — PANTOPRAZOLE SODIUM 40 MG PO TBEC
40.0000 mg | DELAYED_RELEASE_TABLET | Freq: Every day | ORAL | Status: DC
  Administered 2023-09-30 – 2023-10-01 (×2): 40 mg via ORAL
  Filled 2023-09-29 (×2): qty 1

## 2023-09-29 MED ORDER — KETOROLAC TROMETHAMINE 30 MG/ML IJ SOLN
30.0000 mg | Freq: Four times a day (QID) | INTRAMUSCULAR | Status: AC
  Administered 2023-09-29 (×2): 30 mg via INTRAVENOUS
  Filled 2023-09-29 (×2): qty 1

## 2023-09-29 NOTE — Progress Notes (Signed)
   09/29/23 1049  TOC Brief Assessment  Insurance and Status Reviewed  Patient has primary care physician Yes  Home environment has been reviewed resides in private residence  Prior level of function: Independent  Prior/Current Home Services No current home services  Social Drivers of Health Review SDOH reviewed no interventions necessary  Readmission risk has been reviewed Yes  Transition of care needs no transition of care needs at this time

## 2023-09-29 NOTE — Progress Notes (Signed)
 1 Day Post-Op  Subjective: CC: Pain is overall managed, denies BM but endorses flatus and belching. He denies nausea or emesis, tolerating diet well.   Objective: Vital signs in last 24 hours: Temp:  [97.7 F (36.5 C)-98.5 F (36.9 C)] 97.8 F (36.6 C) (07/10 0519) Pulse Rate:  [67-105] 67 (07/10 0519) Resp:  [12-22] 18 (07/10 0519) BP: (91-133)/(58-83) 115/74 (07/10 0519) SpO2:  [88 %-100 %] 96 % (07/10 0519) Weight:  [83 kg] 83 kg (07/09 1104) Last BM Date : 09/28/23  Intake/Output from previous day: 07/09 0701 - 07/10 0700 In: 2597.5 [P.O.:540; I.V.:1947.8; IV Piggyback:109.6] Out: 600 [Urine:550; Blood:50] Intake/Output this shift: No intake/output data recorded.  PE: Physical Exam Constitutional:      General: He is not in acute distress.    Appearance: He is not ill-appearing.  Cardiovascular:     Rate and Rhythm: Normal rate.  Pulmonary:     Effort: Pulmonary effort is normal.  Abdominal:     General: Abdomen is protuberant.     Palpations: Abdomen is soft.     Tenderness: There is no guarding or rebound.     Comments: Mildly distended abdomen, incision site tenderness, incisions C/D/I w/o signs of infx.   Skin:    General: Skin is warm and dry.  Neurological:     General: No focal deficit present.     Mental Status: He is alert and oriented to person, place, and time.     Lab Results:  Recent Labs    09/27/23 0045 09/29/23 0427  WBC 16.1* 11.9*  HGB 14.8 11.9*  HCT 43.4 36.1*  PLT 200 188   BMET Recent Labs    09/27/23 0045 09/29/23 0427  NA 138 133*  K 3.7 3.6  CL 106 101  CO2 23 22  GLUCOSE 118* 123*  BUN 12 7  CREATININE 0.95 0.73  CALCIUM 8.7* 8.1*   PT/INR No results for input(s): LABPROT, INR in the last 72 hours. CMP     Component Value Date/Time   NA 133 (L) 09/29/2023 0427   K 3.6 09/29/2023 0427   CL 101 09/29/2023 0427   CO2 22 09/29/2023 0427   GLUCOSE 123 (H) 09/29/2023 0427   BUN 7 09/29/2023 0427    CREATININE 0.73 09/29/2023 0427   CALCIUM 8.1 (L) 09/29/2023 0427   PROT 6.3 (L) 09/29/2023 0427   ALBUMIN 3.4 (L) 09/29/2023 0427   AST 17 09/29/2023 0427   ALT 21 09/29/2023 0427   ALKPHOS 41 09/29/2023 0427   BILITOT 1.7 (H) 09/29/2023 0427   GFRNONAA >60 09/29/2023 0427   Lipase     Component Value Date/Time   LIPASE 31 09/27/2023 0045    Studies/Results: CT ABDOMEN PELVIS W CONTRAST Result Date: 09/28/2023 EXAM: CT ABDOMEN AND PELVIS WITH CONTRAST 09/28/2023 03:56:25 AM TECHNIQUE: CT of the abdomen and pelvis was performed with the administration of intravenous contrast. Multiplanar reformatted images are provided for review. Automated exposure control, iterative reconstruction, and/or weight based adjustment of the mA/kV was utilized to reduce the radiation dose to as low as reasonably achievable. COMPARISON: None available. CLINICAL HISTORY: Abdominal pain, acute, nonlocalized; Peritonitis or perforation suspected; s/p colonoscopy today with bx of sigmoid and transverse colon polyps, severe AP. abdominal pain that started around 5-6 pm today. Pt is having hot and cold chills, is bloated, has been vomiting, wbc's 16.1, GFR>60, + microhematuria FINDINGS: LOWER CHEST: Mild dependent atelectasis is present. LIVER: The liver is unremarkable. GALLBLADDER AND BILE DUCTS: Gallbladder is  unremarkable. No biliary ductal dilatation. SPLEEN: No acute abnormality. PANCREAS: No acute abnormality. ADRENAL GLANDS: No acute abnormality. KIDNEYS, URETERS AND BLADDER: No stones in the kidneys or ureters. No hydronephrosis. No perinephric or periureteral stranding. Urinary bladder is unremarkable. GI AND BOWEL: Focal inflammatory changes are present at the splenic flexure of the colon. Wall thickening is present. Mild pericolonic stranding is noted. No free air or free fluid is present. The more distal colon is within normal limits. PERITONEUM AND RETROPERITONEUM: No ascites. No free air. VASCULATURE: Scattered  atherosclerotic calcifications are present in the abdominal aorta without aneurysm. LYMPH NODES: No lymphadenopathy. REPRODUCTIVE ORGANS: No acute abnormality. BONES AND SOFT TISSUES: No acute osseous abnormality. Fat herniates into the left inguinal canal. IMPRESSION: 1. Focal inflammatory changes at the splenic flexure of the colon with wall thickening and mild pericolonic stranding, without free air or free fluid. This may be related to the colonoscopy and biopsies performed yesterday. Electronically signed by: Lonni Necessary MD 09/28/2023 04:07 AM EDT RP Workstation: [id number]   DG Chest Port 1 View Result Date: 09/28/2023 CLINICAL DATA:  Recent colonoscopy. Severe abdominal pain, fever, chills EXAM: PORTABLE CHEST 1 VIEW COMPARISON:  12/28/2021 FINDINGS: Low lung volumes. Heart and mediastinal contours are within normal limits. No focal opacities or effusions. No acute bony abnormality. No pneumothorax. IMPRESSION: Low lung volumes.  No active cardiopulmonary disease. Electronically Signed   By: Franky Crease M.D.   On: 09/28/2023 01:11    Anti-infectives: Anti-infectives (From admission, onward)    Start     Dose/Rate Route Frequency Ordered Stop   09/28/23 1300  piperacillin -tazobactam (ZOSYN ) IVPB 3.375 g        3.375 g 12.5 mL/hr over 240 Minutes Intravenous Every 8 hours 09/28/23 0937     09/28/23 0430  piperacillin -tazobactam (ZOSYN ) IVPB 3.375 g        3.375 g 100 mL/hr over 30 Minutes Intravenous  Once 09/28/23 0415 09/28/23 0545        Assessment/Plan POD1- S/P Diagnostic Laparoscopy, Evaluation of the Splenic Flexur, 7/9, Dr. Vernetta   -Multimodal pain management -Monitor for post op ileus, awaiting return of bowel fx -Mobilize as tolerated -IV abx, WBC elevated but down trending.  FEN - Advanced to FLD.  VTE - SCD's, Lovenox  ID - Zosyn    I reviewed nursing notes, last 24 h vitals and pain scores, last 48 h intake and output, last 24 h labs and trends, and last 24  h imaging results.    LOS: 1 day    Eulah Hammonds, [facility] 09/29/2023, 8:12 AM Please see Amion for pager number during day hours 7:00am-4:30pm

## 2023-09-30 LAB — CBC
HCT: 33.4 % — ABNORMAL LOW (ref 39.0–52.0)
Hemoglobin: 11.3 g/dL — ABNORMAL LOW (ref 13.0–17.0)
MCH: 31.7 pg (ref 26.0–34.0)
MCHC: 33.8 g/dL (ref 30.0–36.0)
MCV: 93.6 fL (ref 80.0–100.0)
Platelets: 177 K/uL (ref 150–400)
RBC: 3.57 MIL/uL — ABNORMAL LOW (ref 4.22–5.81)
RDW: 12.8 % (ref 11.5–15.5)
WBC: 8.5 K/uL (ref 4.0–10.5)
nRBC: 0 % (ref 0.0–0.2)

## 2023-09-30 MED ORDER — KETOROLAC TROMETHAMINE 30 MG/ML IJ SOLN
30.0000 mg | Freq: Four times a day (QID) | INTRAMUSCULAR | Status: AC
  Administered 2023-09-30 – 2023-10-01 (×3): 30 mg via INTRAVENOUS
  Filled 2023-09-30 (×3): qty 1

## 2023-09-30 NOTE — Progress Notes (Signed)
 2 Days Post-Op  Subjective: CC: Pain level remains the same as yesterday but has been more focused in the LUQ to left mid and lower abdomen, Denies BM, endorses flatus and belching. He denies nausea or emesis, tolerating diet well. Hasn't had much of an appetite.   Objective: Vital signs in last 24 hours: Temp:  [97.6 F (36.4 C)-98.4 F (36.9 C)] 97.9 F (36.6 C) (07/11 0615) Pulse Rate:  [65-96] 83 (07/11 0615) Resp:  [16-18] 16 (07/11 0615) BP: (117-143)/(83-95) 143/94 (07/11 0615) SpO2:  [92 %-97 %] 97 % (07/11 0615) Last BM Date : 09/28/23  Intake/Output from previous day: 07/10 0701 - 07/11 0700 In: 1508.8 [P.O.:960; I.V.:487.9; IV Piggyback:60.9] Out: -  Intake/Output this shift: Total I/O In: -  Out: 50 [Emesis/NG output:50]  PE: Physical Exam Constitutional:      General: He is not in acute distress.    Appearance: He is not ill-appearing.  Cardiovascular:     Rate and Rhythm: Normal rate.  Pulmonary:     Effort: Pulmonary effort is normal.  Abdominal:     General: Abdomen is protuberant.     Palpations: Abdomen is soft with tenderness in LUQ and LLQ.     Tenderness: There is no guarding or rebound.     Comments: Mildly distended abdomen, incision site tenderness, incisions C/D/I w/o signs of infx.   Skin:    General: Skin is warm and dry.  Neurological:     General: No focal deficit present.     Mental Status: He is alert and oriented to person, place, and time.   Lab Results:  Recent Labs    09/29/23 0427 09/30/23 0415  WBC 11.9* 8.5  HGB 11.9* 11.3*  HCT 36.1* 33.4*  PLT 188 177   BMET Recent Labs    09/29/23 0427  NA 133*  K 3.6  CL 101  CO2 22  GLUCOSE 123*  BUN 7  CREATININE 0.73  CALCIUM 8.1*   PT/INR No results for input(s): LABPROT, INR in the last 72 hours. CMP     Component Value Date/Time   NA 133 (L) 09/29/2023 0427   K 3.6 09/29/2023 0427   CL 101 09/29/2023 0427   CO2 22 09/29/2023 0427   GLUCOSE 123 (H)  09/29/2023 0427   BUN 7 09/29/2023 0427   CREATININE 0.73 09/29/2023 0427   CALCIUM 8.1 (L) 09/29/2023 0427   PROT 6.3 (L) 09/29/2023 0427   ALBUMIN 3.4 (L) 09/29/2023 0427   AST 17 09/29/2023 0427   ALT 21 09/29/2023 0427   ALKPHOS 41 09/29/2023 0427   BILITOT 1.7 (H) 09/29/2023 0427   GFRNONAA >60 09/29/2023 0427   Lipase     Component Value Date/Time   LIPASE 31 09/27/2023 0045    Studies/Results: No results found.   Anti-infectives: Anti-infectives (From admission, onward)    Start     Dose/Rate Route Frequency Ordered Stop   09/28/23 1300  piperacillin -tazobactam (ZOSYN ) IVPB 3.375 g        3.375 g 12.5 mL/hr over 240 Minutes Intravenous Every 8 hours 09/28/23 0937     09/28/23 0430  piperacillin -tazobactam (ZOSYN ) IVPB 3.375 g        3.375 g 100 mL/hr over 30 Minutes Intravenous  Once 09/28/23 0415 09/28/23 0545        Assessment/Plan POD2- S/P Diagnostic Laparoscopy, Evaluation of the Splenic Flexur, 7/9, Dr. Vernetta   -Continue multimodal pain management -Monitor for post op ileus, awaiting return of bowel fx -Mobilize  as tolerated -IV abx, WBC normalized.  FEN - FLD.  VTE - SCD's, Lovenox  ID - Zosyn    I reviewed nursing notes, last 24 h vitals and pain scores, last 48 h intake and output, last 24 h labs and trends, and last 24 h imaging results.    LOS: 2 days    Eulah Hammonds, [facility] 09/30/2023, 8:22 AM Please see Amion for pager number during day hours 7:00am-4:30pm

## 2023-10-01 LAB — CBC
HCT: 33.1 % — ABNORMAL LOW (ref 39.0–52.0)
Hemoglobin: 11.3 g/dL — ABNORMAL LOW (ref 13.0–17.0)
MCH: 31.2 pg (ref 26.0–34.0)
MCHC: 34.1 g/dL (ref 30.0–36.0)
MCV: 91.4 fL (ref 80.0–100.0)
Platelets: 188 K/uL (ref 150–400)
RBC: 3.62 MIL/uL — ABNORMAL LOW (ref 4.22–5.81)
RDW: 12.6 % (ref 11.5–15.5)
WBC: 6.2 K/uL (ref 4.0–10.5)
nRBC: 0 % (ref 0.0–0.2)

## 2023-10-01 LAB — BASIC METABOLIC PANEL WITH GFR
Anion gap: 7 (ref 5–15)
BUN: 9 mg/dL (ref 6–20)
CO2: 28 mmol/L (ref 22–32)
Calcium: 8.1 mg/dL — ABNORMAL LOW (ref 8.9–10.3)
Chloride: 102 mmol/L (ref 98–111)
Creatinine, Ser: 0.88 mg/dL (ref 0.61–1.24)
GFR, Estimated: >60 mL/min (ref >60)
Glucose, Bld: 89 mg/dL (ref 70–99)
Potassium: 3.2 mmol/L — ABNORMAL LOW (ref 3.5–5.1)
Sodium: 137 mmol/L (ref 135–145)

## 2023-10-01 MED ORDER — OXYCODONE HCL 5 MG PO TABS: 5.0000 mg | ORAL_TABLET | Freq: Four times a day (QID) | ORAL | 0 refills | Status: DC | PRN

## 2023-10-01 MED ORDER — OXYCODONE HCL 5 MG PO TABS: 5.0000 mg | ORAL_TABLET | Freq: Four times a day (QID) | ORAL | 0 refills | Status: AC | PRN

## 2023-10-01 MED ORDER — AMOXICILLIN-POT CLAVULANATE 875-125 MG PO TABS: 1.0000 | ORAL_TABLET | Freq: Two times a day (BID) | ORAL | 0 refills | Status: AC

## 2023-10-01 MED ORDER — AMOXICILLIN-POT CLAVULANATE 875-125 MG PO TABS
1.0000 | ORAL_TABLET | Freq: Two times a day (BID) | ORAL | 0 refills | Status: DC
Start: 2023-10-01 — End: 2023-10-01

## 2023-10-01 NOTE — Plan of Care (Signed)
  Problem: Safety: Goal: Ability to remain free from injury will improve Outcome: Progressing   Problem: Skin Integrity: Goal: Risk for impaired skin integrity will decrease Outcome: Progressing   Problem: Nutrition: Goal: Adequate nutrition will be maintained Outcome: Not Progressing   Problem: Pain Managment: Goal: General experience of comfort will improve and/or be controlled Outcome: Not Progressing

## 2023-10-01 NOTE — Discharge Instructions (Signed)
 [facility] ______[facility] LAPAROSCOPIC SURGERY: POST OP INSTRUCTIONS Always review your discharge instruction sheet given to you by the facility where your surgery was performed. IF YOU HAVE DISABILITY OR FAMILY LEAVE FORMS, YOU MUST BRING THEM TO THE OFFICE FOR PROCESSING.   DO NOT GIVE THEM TO YOUR DOCTOR.  A prescription for pain medication may be given to you upon discharge.  Take your pain medication as prescribed, if needed.  If narcotic pain medicine is not needed, then you may take acetaminophen  (Tylenol ) or ibuprofen (Advil) as needed. Take your usually prescribed medications unless otherwise directed. If you need a refill on your pain medication, please contact your pharmacy.  They will contact our office to request authorization. Prescriptions will not be filled after 5pm or on week-ends. You should follow a light diet the first few days after arrival home, such as soup and crackers, etc.  Be sure to include lots of fluids daily. Most patients will experience some swelling and bruising in the area of the incisions.  Ice packs will help.  Swelling and bruising can take several days to resolve.  It is common to experience some constipation if taking pain medication after surgery.  Increasing fluid intake and taking a stool softener (such as Colace) will usually help or prevent this problem from occurring.  A mild laxative (Milk of Magnesia or Miralax) should be taken according to package instructions if there are no bowel movements after 48 hours. Unless discharge instructions indicate otherwise, you may remove your bandages 24-48 hours after surgery, and you may shower at that time.  You may have steri-strips (small skin tapes) in place directly over the incision.  These strips should be left on the skin for 7-10 days.  If your surgeon used skin glue on the incision, you may shower in 24 hours.  The glue will flake off over the next 2-3 weeks.  Any sutures or staples will be  removed at the office during your follow-up visit. ACTIVITIES:  You may resume regular (light) daily activities beginning the next day--such as daily self-care, walking, climbing stairs--gradually increasing activities as tolerated.  You may have sexual intercourse when it is comfortable.  Refrain from any heavy lifting or straining until approved by your doctor. You may drive when you are no longer taking prescription pain medication, you can comfortably wear a seatbelt, and you can safely maneuver your car and apply brakes. RETURN TO WORK:  __________________________________________________________ Tommy Kaufman should see your doctor in the office for a follow-up appointment approximately 2-3 weeks after your surgery.  Make sure that you call for this appointment within a day or two after you arrive home to insure a convenient appointment time. OTHER INSTRUCTIONS: START TAKING MIRALAX DAILY UNTIL HAVING BM'S NO LIFTING MORE THAN 15 POUNDS FOR 2 WEEKS FROM THE DATE OF SURGERY OK TO SHOWER__________________________________________________________________________________________________________________________ __________________________________________________________________________________________________________________________ WHEN TO CALL YOUR DOCTOR: Fever over 101.0 Inability to urinate Continued bleeding from incision. Increased pain, redness, or drainage from the incision. Increasing abdominal pain  The clinic staff is available to answer your questions during regular business hours.  Please don't hesitate to call and ask to speak to one of the nurses for clinical concerns.  If you have a medical emergency, go to the nearest emergency room or call 911.  A surgeon from [facility] is always on call at the hospital. [address], [address], [place], [place]  [address] ? [address] [address], [place], [place]   [address] [phone] ? [phone] ?  FAX [phone] Web site:  [email or web address]

## 2023-10-01 NOTE — Progress Notes (Signed)
 Tommy Kaufman to be D/C'd Home per MD order.  Discussed with the patient and all questions fully answered.  VSS, Skin clean, dry and intact without evidence of skin break down, no evidence of skin tears noted. IV catheter discontinued intact. Site without signs and symptoms of complications. Dressing and pressure applied.  An After Visit Summary was printed and given to the patient. Patient prescriptions sent to his pharmacy.  D/c education completed with patient/family including follow up instructions, medication list, d/c activities limitations if indicated, with other d/c instructions as indicated by MD - patient able to verbalize understanding, all questions fully answered.   Patient instructed to return to ED, call 911, or call MD for any changes in condition.   Patient escorted via WC, and D/C home via private auto.  [name] 10/01/2023 12:07 PM

## 2023-10-01 NOTE — Progress Notes (Signed)
 3 Days Post-Op   Subjective/Chief Complaint: Pain improving Passing flatus and tolerating po    Objective: Vital signs in last 24 hours: Temp:  [98.1 F (36.7 C)-98.9 F (37.2 C)] 98.6 F (37 C) (07/12 0518) Pulse Rate:  [75-81] 75 (07/12 0518) Resp:  [16] 16 (07/12 0518) BP: (131-150)/(85-90) 140/88 (07/12 0518) SpO2:  [96 %-97 %] 97 % (07/12 0518) Last BM Date : 09/27/23  Intake/Output from previous day: 07/11 0701 - 07/12 0700 In: 1360 [P.O.:1360] Out: 550 [Urine:500; Emesis/NG output:50] Intake/Output this shift: No intake/output data recorded.  Exam: Awake and alert Looks comfortable Abdomen soft, less tender  Lab Results:  Recent Labs    09/30/23 0415 10/01/23 0529  WBC 8.5 6.2  HGB 11.3* 11.3*  HCT 33.4* 33.1*  PLT 177 188   BMET Recent Labs    09/29/23 0427 10/01/23 0529  NA 133* 137  K 3.6 3.2*  CL 101 102  CO2 22 28  GLUCOSE 123* 89  BUN 7 9  CREATININE 0.73 0.88  CALCIUM 8.1* 8.1*   PT/INR No results for input(s): LABPROT, INR in the last 72 hours. ABG No results for input(s): PHART, HCO3 in the last 72 hours.  Invalid input(s): PCO2, PO2  Studies/Results: No results found.  Anti-infectives: Anti-infectives (From admission, onward)    Start     Dose/Rate Route Frequency Ordered Stop   09/28/23 1300  piperacillin -tazobactam (ZOSYN ) IVPB 3.375 g        3.375 g 12.5 mL/hr over 240 Minutes Intravenous Every 8 hours 09/28/23 0937     09/28/23 0430  piperacillin -tazobactam (ZOSYN ) IVPB 3.375 g        3.375 g 100 mL/hr over 30 Minutes Intravenous  Once 09/28/23 0415 09/28/23 0545       Assessment/Plan: s/p Procedure(s): EXPLORATION, ABDOMEN, LAPAROSCOPIC, diagnostic laparoscopy, evaluation of splenic flexure (N/A)  POD#3  -he is feeling better and still shows no signs of bowel perforation -will discharge home on oral antibiotics for 3 more days   Vicenta Poli 10/01/2023

## 2023-10-01 NOTE — Discharge Summary (Signed)
 Physician Discharge Summary  Patient ID: Tommy Kaufman MRN: [id number] DOB/AGE: 1970-05-17 53 y.o.  Admit date: 09/27/2023 Discharge date: 10/01/2023  Admission Diagnoses:  Discharge Diagnoses:  Principal Problem:   Acute colitis   Discharged Condition: good  Hospital Course: Patient was admitted with severe abdominal pain status post colonoscopy with polypectomy.  He has significant inflammation of the splenic flexure of the colon.  Because of peritonitis, he was taken to the operating room for a diagnostic laparoscopy.  This showed inflammation of the splenic flexure but no evidence of perforation.  He slowly improved over the neck several days with IV antibiotics and conservative management.  By the day of discharge he was ambulating well, his pain was controlled, he is passing flatus and tolerating p.o. diet so decision was made to discharge patient home  Consults: None  Significant Diagnostic Studies:   Treatments: Diagnostic laparoscopy, IV antibiotics  Discharge Exam: Blood pressure (!) 140/88, pulse 75, temperature 98.6 F (37 C), temperature source Oral, resp. rate 16, height 5' 8 (1.727 m), weight 83 kg, SpO2 97%. General appearance: alert, cooperative, and no distress Resp: clear to auscultation bilaterally Cardio: regular rate and rhythm, S1, S2 normal, no murmur, click, rub or gallop Abdomen: Incisions healing well.  Some ecchymosis of the abdominal wall.  Soft and appropriately tender Disposition: Discharge home   Allergies as of 10/01/2023   No Known Allergies      Medication List     TAKE these medications    amoxicillin -clavulanate 875-125 MG tablet Commonly known as: AUGMENTIN  Take 1 tablet by mouth 2 (two) times daily.   gabapentin 300 MG capsule Commonly known as: NEURONTIN Take 300 mg by mouth 2 (two) times daily as needed.   ketorolac  10 MG tablet Commonly known as: TORADOL  Take 1 tablet (10 mg total) by mouth every 6 (six) hours as needed  (pain).   ondansetron  4 MG disintegrating tablet Commonly known as: ZOFRAN -ODT Take 1 tablet (4 mg total) by mouth every 8 (eight) hours as needed for nausea or vomiting.   oxyCODONE  5 MG immediate release tablet Commonly known as: Oxy IR/ROXICODONE  Take 1 tablet (5 mg total) by mouth every 6 (six) hours as needed for moderate pain (pain score 4-6) or severe pain (pain score 7-10).   pantoprazole  20 MG tablet Commonly known as: PROTONIX  Take 20 mg by mouth daily as needed.   tamsulosin  0.4 MG Caps capsule Commonly known as: FLOMAX  Take 1 capsule (0.4 mg total) by mouth daily.        Follow-up Information     Vernetta Berg, MD. Schedule an appointment as soon as possible for a visit in 2 week(s).   Specialty: General Surgery Contact information: [address] [place] [address] [phone]                 Signed: Berg Vernetta 10/01/2023, 9:10 AM

## 2024-01-09 ENCOUNTER — Other Ambulatory Visit (HOSPITAL_COMMUNITY): Payer: Self-pay

## 2024-01-09 DIAGNOSIS — R079 Chest pain, unspecified: Secondary | ICD-10-CM

## 2024-01-14 ENCOUNTER — Emergency Department (HOSPITAL_COMMUNITY)

## 2024-01-14 ENCOUNTER — Emergency Department (HOSPITAL_COMMUNITY)
Admission: EM | Admit: 2024-01-14 | Discharge: 2024-01-14 | Disposition: A | Discharge status: Home / Self Care | Attending: Emergency Medicine | Admitting: Emergency Medicine

## 2024-01-14 DIAGNOSIS — M79602 Pain in left arm: Secondary | ICD-10-CM | POA: Diagnosis present

## 2024-01-14 DIAGNOSIS — M5412 Radiculopathy, cervical region: Secondary | ICD-10-CM | POA: Diagnosis not present

## 2024-01-14 LAB — CBC WITH DIFFERENTIAL/PLATELET
Abs Immature Granulocytes: 0.03 K/uL (ref 0.00–0.07)
Basophils Absolute: 0 K/uL (ref 0.0–0.1)
Basophils Relative: 1 %
Eosinophils Absolute: 0.2 K/uL (ref 0.0–0.5)
Eosinophils Relative: 3 %
HCT: 42.6 % (ref 39.0–52.0)
Hemoglobin: 14.1 g/dL (ref 13.0–17.0)
Immature Granulocytes: 0 %
Lymphocytes Relative: 41 %
Lymphs Abs: 3.1 K/uL (ref 0.7–4.0)
MCH: 29.6 pg (ref 26.0–34.0)
MCHC: 33.1 g/dL (ref 30.0–36.0)
MCV: 89.5 fL (ref 80.0–100.0)
Monocytes Absolute: 0.7 K/uL (ref 0.1–1.0)
Monocytes Relative: 9 %
Neutro Abs: 3.6 K/uL (ref 1.7–7.7)
Neutrophils Relative %: 46 %
Platelets: 199 K/uL (ref 150–400)
RBC: 4.76 MIL/uL (ref 4.22–5.81)
RDW: 12.8 % (ref 11.5–15.5)
WBC: 7.7 K/uL (ref 4.0–10.5)
nRBC: 0 % (ref 0.0–0.2)

## 2024-01-14 LAB — BASIC METABOLIC PANEL WITH GFR
Anion gap: 11 (ref 5–15)
BUN: 14 mg/dL (ref 6–20)
CO2: 24 mmol/L (ref 22–32)
Calcium: 9 mg/dL (ref 8.9–10.3)
Chloride: 104 mmol/L (ref 98–111)
Creatinine, Ser: 0.78 mg/dL (ref 0.61–1.24)
GFR, Estimated: >60 mL/min (ref >60)
Glucose, Bld: 113 mg/dL — ABNORMAL HIGH (ref 70–99)
Potassium: 4.1 mmol/L (ref 3.5–5.1)
Sodium: 138 mmol/L (ref 135–145)

## 2024-01-14 LAB — TROPONIN T, HIGH SENSITIVITY
Troponin T High Sensitivity: <15 ng/L (ref 0–19)
Troponin T High Sensitivity: <15 ng/L (ref 0–19)

## 2024-01-14 MED ORDER — DICLOFENAC EPOLAMINE 1.3 % EX PTCH
1.0000 | MEDICATED_PATCH | Freq: Two times a day (BID) | CUTANEOUS | Status: DC
  Administered 2024-01-14: 1 via TRANSDERMAL
  Filled 2024-01-14: qty 1

## 2024-01-14 MED ORDER — MORPHINE SULFATE (PF) 4 MG/ML IV SOLN
4.0000 mg | Freq: Once | INTRAVENOUS | Status: AC
  Administered 2024-01-14: 4 mg via INTRAVENOUS
  Filled 2024-01-14: qty 1

## 2024-01-14 MED ORDER — PREDNISONE 20 MG PO TABS
60.0000 mg | ORAL_TABLET | ORAL | Status: AC
  Administered 2024-01-14: 60 mg via ORAL
  Filled 2024-01-14: qty 3

## 2024-01-14 MED ORDER — PREDNISONE 20 MG PO TABS: 40.0000 mg | ORAL_TABLET | Freq: Every day | ORAL | 0 refills | Status: AC

## 2024-01-14 MED ORDER — DICLOFENAC EPOLAMINE 1.3 % EX PTCH: 1.0000 | MEDICATED_PATCH | Freq: Two times a day (BID) | CUTANEOUS | 1 refills | Status: AC

## 2024-01-14 MED ORDER — KETOROLAC TROMETHAMINE 30 MG/ML IJ SOLN
15.0000 mg | Freq: Once | INTRAMUSCULAR | Status: AC
  Administered 2024-01-14: 15 mg via INTRAVENOUS
  Filled 2024-01-14: qty 1

## 2024-01-14 MED ORDER — SODIUM CHLORIDE 0.9 % IV BOLUS
500.0000 mL | Freq: Once | INTRAVENOUS | Status: AC
  Administered 2024-01-14: 500 mL via INTRAVENOUS

## 2024-01-14 MED ORDER — HYDROCODONE-ACETAMINOPHEN 5-325 MG PO TABS: 1.0000 | ORAL_TABLET | Freq: Four times a day (QID) | ORAL | 0 refills | Status: AC | PRN

## 2024-01-14 NOTE — ED Provider Notes (Signed)
 [facility] AT [facility] Provider Note   CSN: [id number] Arrival date & time: 01/14/24  1357     Patient presents with: Shoulder Pain   Tommy Kaufman is a 53 y.o. male.   HPI Patient with multiple medical problems including ongoing therapy for cervical spine disease, including injections.,  Now presents with pain throughout the left arm, paraspinal region. He is here with his wife who assists with the history.  He notes that after attempting to lift a bag from a car he felt sudden onset of numbness and tingling in an ulnar nerve distribution.  Some radiation towards the chest, but no jaw pain, no syncope, no shortness of breath.  [facility] he was sent here for evaluation.    Prior to Admission medications   Medication Sig Start Date End Date Taking? Authorizing Provider  diclofenac (FLECTOR) 1.3 % PT[facility] Place 1 patch onto the skin 2 (two) times daily. 01/14/24  Yes Garrick Charleston, MD  HYDROcodone-acetaminophen  (NORCO/VICODIN) 5-325 MG tablet Take 1 tablet by mouth every 6 (six) hours as needed for severe pain (pain score 7-10). 01/14/24  Yes Garrick Charleston, MD  predniSONE (DELTASONE) 20 MG tablet Take 2 tablets (40 mg total) by mouth daily with breakfast. For the next four days 01/14/24  Yes Garrick Charleston, MD  amoxicillin -clavulanate (AUGMENTIN ) 875-125 MG tablet Take 1 tablet by mouth 2 (two) times daily. 10/01/23   Vernetta Berg, MD  gabapentin (NEURONTIN) 300 MG capsule Take 300 mg by mouth 2 (two) times daily as needed.    [provider]  ketorolac  (TORADOL ) 10 MG tablet Take 1 tablet (10 mg total) by mouth every 6 (six) hours as needed (pain). 03/12/23   Vonna Sharlet POUR, MD  ondansetron  (ZOFRAN -ODT) 4 MG disintegrating tablet Take 1 tablet (4 mg total) by mouth every 8 (eight) hours as needed for nausea or vomiting. 03/12/23   Vonna Sharlet POUR, MD  oxyCODONE  (OXY IR/ROXICODONE ) 5 MG  immediate release tablet Take 1 tablet (5 mg total) by mouth every 6 (six) hours as needed for moderate pain (pain score 4-6) or severe pain (pain score 7-10). 10/01/23   Vernetta Berg, MD  pantoprazole  (PROTONIX ) 20 MG tablet Take 20 mg by mouth daily as needed.    [provider]  tamsulosin  (FLOMAX ) 0.4 MG CAPS capsule Take 1 capsule (0.4 mg total) by mouth daily. Patient not taking: Reported on 09/28/2023 03/12/23   Vonna Sharlet POUR, MD    Allergies: Patient has no known allergies.    Review of Systems  Updated Vital Signs Pulse 80   Temp 98 F (36.7 C) (Oral)   Resp 18   Ht 1.727 m (5' 8)   Wt 83.5 kg   SpO2 100%   BMI 27.98 kg/m   Physical Exam Vitals and nursing note reviewed.  Constitutional:      General: He is not in acute distress.    Appearance: He is well-developed.  HENT:     Head: Normocephalic and atraumatic.  Eyes:     Conjunctiva/sclera: Conjunctivae normal.  Cardiovascular:     Rate and Rhythm: Normal rate and regular rhythm.     Pulses: Normal pulses.  Pulmonary:     Effort: Pulmonary effort is normal. No respiratory distress.     Breath sounds: No stridor.  Abdominal:     General: There is no distension.  Musculoskeletal:       Arms:  Skin:    General: Skin  is warm and dry.  Neurological:     Mental Status: He is alert and oriented to person, place, and time.     Comments: Patient with signs and symptoms consistent with left radiculopathy ulnar likely, with tingling in the medial 2 digits, dorsal, radiating towards the brachial plexus     (all labs ordered are listed, but only abnormal results are displayed) Labs Reviewed  BASIC METABOLIC PANEL WITH GFR - Abnormal; Notable for the following components:      Result Value   Glucose, Bld 113 (*)    All other components within normal limits  CBC WITH DIFFERENTIAL/PLATELET  TROPONIN T, HIGH SENSITIVITY  TROPONIN T, HIGH SENSITIVITY    EKG: EKG  Interpretation Date/Time:  Saturday January 14 2024 16:19:47 EDT Ventricular Rate:  57 PR Interval:  132 QRS Duration:  103 QT Interval:  441 QTC Calculation: 430 R Axis:   15  Text Interpretation: Sinus rhythm RSR' in V1 or V2, probably normal variant Confirmed by Garrick Charleston [phone]) on 01/14/2024 4:34:52 PM  Radiology: DG Chest 2 View Result Date: 01/14/2024 CLINICAL DATA:  Chest tightness and left shoulder pain. EXAM: CHEST - 2 VIEW COMPARISON:  September 28, 2023 FINDINGS: The heart size and mediastinal contours are within normal limits. Both lungs are clear. The visualized skeletal structures are unremarkable. IMPRESSION: No active cardiopulmonary disease. Electronically Signed   By: Suzen Dials M.D.   On: 01/14/2024 15:30   DG Shoulder Left Result Date: 01/14/2024 CLINICAL DATA:  Chest tightness and left shoulder pain. EXAM: DG SHOULDER 2+V*L* COMPARISON:  None Available. FINDINGS: There is no evidence of fracture or dislocation. There is no evidence of arthropathy or other focal bone abnormality. Soft tissues are unremarkable. IMPRESSION: Negative. Electronically Signed   By: Suzen Dials M.D.   On: 01/14/2024 15:20     Procedures   Medications Ordered in the ED  diclofenac (FLECTOR) 1.3 % 1 patch (1 patch Transdermal Patch Applied 01/14/24 1745)  ketorolac  (TORADOL ) 30 MG/ML injection 15 mg (15 mg Intravenous Given 01/14/24 1452)  sodium chloride  0.9 % bolus 500 mL (0 mLs Intravenous Stopped 01/14/24 1623)  morphine (PF) 4 MG/ML injection 4 mg (4 mg Intravenous Given 01/14/24 1450)  predniSONE (DELTASONE) tablet 60 mg (60 mg Oral Given 01/14/24 1742)                                    Medical Decision Making Patient without history of cardiac disease with history of cervical spine disease presents with pain throughout the left upper extremity and neck consistent with ulnar radiculopathy, though given the radiation to the chest, age, ACS consideration.  Patient's  initial neuroexam is reassuring, pulses normal, little evidence for thromboembolic processes. Cardiac 80 sinus normal pulse ox 100% room air normal  Amount and/or Complexity of Data Reviewed Independent Historian: spouse Labs: ordered. Decision-making details documented in ED Course. Radiology: ordered. ECG/medicine tests: ordered and independent interpretation performed. Decision-making details documented in ED Course.  Risk Prescription drug management. Decision regarding hospitalization.   6:31 PM Second troponin normal, with low risk profile low suspicion for atypical ACS.  Pulses remained normal, low suspicion for occlusion patient has improved with anti-inflammatories, both NSAID and steroid as well as analgesics. Patient discharged in stable condition.     Final diagnoses:  Cervical radiculopathy    ED Discharge Orders          Ordered  diclofenac (FLECTOR) 1.3 % PTCH  2 times daily        01/14/24 1830    HYDROcodone-acetaminophen  (NORCO/VICODIN) 5-325 MG tablet  Every 6 hours PRN        01/14/24 1830    predniSONE (DELTASONE) 20 MG tablet  Daily with breakfast        01/14/24 1830               Garrick Charleston, MD 01/14/24 1831

## 2024-01-14 NOTE — Discharge Instructions (Signed)
 Be sure to follow-up with Dr. Bonner and the [facility] on Monday.  Return here for concerning changes in your condition.

## 2024-01-14 NOTE — ED Triage Notes (Signed)
 Yesterday was attempting to lift a bag from the car and then could not use that shoulder/arm afterwards. Numbness/tingling in the fingers to the shoulder/neck. Cramps across left side of chest with tightness. Denies SOB. Denies jaw pain.  On Tuesday has a spinal injection into C6 area. Has them routinely.

## 2024-02-01 ENCOUNTER — Ambulatory Visit (HOSPITAL_COMMUNITY): Admission: RE | Admit: 2024-02-01 | Source: Ambulatory Visit

## 2024-02-01 ENCOUNTER — Encounter (HOSPITAL_COMMUNITY): Payer: Self-pay
